# Patient Record
Sex: Female | Born: 1935 | Smoking: Current every day smoker
Health system: Southern US, Community
[De-identification: ages and names within clinical notes are randomized; demographics above are authoritative.]

## PROBLEM LIST (undated history)

## (undated) DIAGNOSIS — M1990 Unspecified osteoarthritis, unspecified site: Secondary | ICD-10-CM

## (undated) HISTORY — PX: ABDOMINAL SURGERY: SHX537

## (undated) HISTORY — PX: ABDOMINAL HYSTERECTOMY: SHX81

## (undated) HISTORY — DX: Unspecified osteoarthritis, unspecified site: M19.90

## (undated) HISTORY — PX: FOOT SURGERY: SHX648

---

## 2014-09-25 ENCOUNTER — Ambulatory Visit: Payer: Self-pay | Admitting: Podiatry

## 2014-10-09 ENCOUNTER — Ambulatory Visit: Payer: Self-pay | Admitting: Podiatry

## 2014-10-23 ENCOUNTER — Ambulatory Visit: Payer: Self-pay | Admitting: Podiatry

## 2014-11-08 ENCOUNTER — Ambulatory Visit (INDEPENDENT_AMBULATORY_CARE_PROVIDER_SITE_OTHER): Payer: Medicare Other | Admitting: Podiatry

## 2014-11-08 ENCOUNTER — Encounter: Payer: Self-pay | Admitting: Podiatry

## 2014-11-08 VITALS — BP 131/64 | HR 67 | Resp 12

## 2014-11-08 DIAGNOSIS — B351 Tinea unguium: Secondary | ICD-10-CM

## 2014-11-08 DIAGNOSIS — Q828 Other specified congenital malformations of skin: Secondary | ICD-10-CM

## 2014-11-08 DIAGNOSIS — M79676 Pain in unspecified toe(s): Secondary | ICD-10-CM

## 2014-11-08 NOTE — Patient Instructions (Signed)

## 2014-11-08 NOTE — Progress Notes (Signed)
   Subjective:    Patient ID: Jackie Chandler, female    DOB: 02-Jan-1925, 80 y.o.   MRN: [id number]  HPI  N- THICK, LONG L-B/L  TOENAILS D-LONG TERM O-SLOWLY C-SAME A-NONE T-TRIM  Patient denies previous podiatric care  Review of Systems  Musculoskeletal: Positive for gait problem.  All other systems reviewed and are negative.      Objective:   Physical Exam  Orientated 3 patient presents with daughter  Vascular: DP pulses 2/4 bilaterally PT pulses 2/4 bilaterally Capillary fill is immediate bilaterally Mild pitting edema dorsal ankles bilaterally  Neurological: Ankle reflex equal and reactive bilaterally Vibratory sensation nonreactive bilaterally Sensation to 10 g monofilament wire intact 1/5 right and 0/5 left  Dermatological: The toenails are elongated, hypertrophic, discolored 6-10 Nucleated plantar keratoses fifth MPJ bilaterally Surgical scar dorsal second MPJ  Musculoskeletal Hammertoe deformity second right No restriction ankle, subtalar, midtarsal joints bilaterally      Assessment & Plan:   Assessment: Peripheral neuropathy bilaterally Symptomatic onychomycoses 6-10 Porokeratosis 2  Plan: Debride nails 10 and porokeratosis 2 without a bleeding  Reappoint 3 months

## 2014-11-09 ENCOUNTER — Encounter: Payer: Self-pay | Admitting: Podiatry

## 2015-02-07 ENCOUNTER — Ambulatory Visit: Payer: Medicare Other | Admitting: Podiatry

## 2015-03-21 ENCOUNTER — Encounter: Payer: Self-pay | Admitting: Podiatry

## 2015-03-21 ENCOUNTER — Ambulatory Visit (INDEPENDENT_AMBULATORY_CARE_PROVIDER_SITE_OTHER): Payer: Medicare Other | Admitting: Podiatry

## 2015-03-21 DIAGNOSIS — Q828 Other specified congenital malformations of skin: Secondary | ICD-10-CM | POA: Diagnosis not present

## 2015-03-21 DIAGNOSIS — M79676 Pain in unspecified toe(s): Secondary | ICD-10-CM

## 2015-03-21 DIAGNOSIS — B351 Tinea unguium: Secondary | ICD-10-CM | POA: Diagnosis not present

## 2015-03-21 NOTE — Progress Notes (Signed)
Patient ID: Jackie Chandler, female   DOB: 04-05-1925, 81 y.o.   MRN: [id number]   Subjective This patient presents complaining of painful toenails and a painful plantar callus on the right foot  Objective: Orientated 3 patient presents with her daughter and treatment room  The toenails are elongated, hypertrophic, incurvated, discolored and tender to direct palpation 6-10 Large nucleated plantar keratoses sub-fifth MPJ right  Assessment: Symptomatic onychomycoses 6-10 Porokeratosis 1  Plan: Debride nails 10 and keratoses 1 without any bleeding  Reappoint 3 months

## 2015-06-27 ENCOUNTER — Encounter: Payer: Self-pay | Admitting: Podiatry

## 2015-06-27 ENCOUNTER — Ambulatory Visit (INDEPENDENT_AMBULATORY_CARE_PROVIDER_SITE_OTHER): Payer: Medicare Other | Admitting: Podiatry

## 2015-06-27 DIAGNOSIS — M79676 Pain in unspecified toe(s): Secondary | ICD-10-CM

## 2015-06-27 DIAGNOSIS — Q828 Other specified congenital malformations of skin: Secondary | ICD-10-CM | POA: Diagnosis not present

## 2015-06-27 DIAGNOSIS — B351 Tinea unguium: Secondary | ICD-10-CM

## 2015-06-28 NOTE — Progress Notes (Signed)
Patient ID: Jackie Chandler, female   DOB: 09/05/1925, 81 y.o.   MRN: [id number]  Subjective: This patient presents today complaining of painful toenails and a painful plantar callus on the right foot  Objective: Nucleated keratoses sub-fifth MPJ right The toenails are hypertrophic, elongated, incurvated, discolored and tender to direct palpation 6-10  Assessment: Symptomatic onychomycoses 6-10 Porokeratosis 1  Plan: Debrided toenails 10 and keratoses 1 without any bleeding  Reappoint 3

## 2015-09-10 ENCOUNTER — Ambulatory Visit: Payer: Self-pay | Admitting: Internal Medicine

## 2015-09-28 ENCOUNTER — Ambulatory Visit: Payer: Self-pay | Admitting: Internal Medicine

## 2015-10-02 ENCOUNTER — Ambulatory Visit (INDEPENDENT_AMBULATORY_CARE_PROVIDER_SITE_OTHER): Payer: Medicare Other | Admitting: Podiatry

## 2015-10-02 ENCOUNTER — Encounter: Payer: Self-pay | Admitting: Podiatry

## 2015-10-02 DIAGNOSIS — M79676 Pain in unspecified toe(s): Secondary | ICD-10-CM

## 2015-10-02 DIAGNOSIS — Q828 Other specified congenital malformations of skin: Secondary | ICD-10-CM

## 2015-10-02 DIAGNOSIS — B351 Tinea unguium: Secondary | ICD-10-CM

## 2015-10-02 NOTE — Progress Notes (Signed)
Patient ID: Graciella BeltonBernice I Steele, female   DOB: 05/04/1925, 81 y.o.   MRN: [id number]  Subjective: This patient presents today complaining of painful toenails and walking wearing shoes and a painful plantar callus on the right foot. Patient is requesting debridement of nails and the painful callus  Objective: Pleasant orientated 3 No open skin lesions bilaterally The toenails are hypertrophic, discolored, incurvated, and tender direct palpation 6-10 Large nucleated plantar keratoses sub-fifth MPJ right  Assessment: Symptomatic onychomycoses 6-10 Porokeratosis 1  Plan: Debrided toenails 10 mechanically and electronically without any bleeding Debride were keratoses 1 without any bleeding  Reappoint 3 months

## 2015-11-02 ENCOUNTER — Other Ambulatory Visit (INDEPENDENT_AMBULATORY_CARE_PROVIDER_SITE_OTHER): Payer: Medicare Other

## 2015-11-02 ENCOUNTER — Ambulatory Visit (INDEPENDENT_AMBULATORY_CARE_PROVIDER_SITE_OTHER): Payer: Medicare Other | Admitting: Internal Medicine

## 2015-11-02 ENCOUNTER — Encounter: Payer: Self-pay | Admitting: Internal Medicine

## 2015-11-02 VITALS — BP 152/68 | HR 74 | Temp 97.9°F | Resp 18 | Ht 64.0 in | Wt 171.0 lb

## 2015-11-02 DIAGNOSIS — R600 Localized edema: Secondary | ICD-10-CM

## 2015-11-02 LAB — COMPREHENSIVE METABOLIC PANEL
ALT: 18 U/L (ref 0–35)
AST: 23 U/L (ref 0–37)
Albumin: 4 g/dL (ref 3.5–5.2)
Alkaline Phosphatase: 63 U/L (ref 39–117)
BILIRUBIN TOTAL: 0.4 mg/dL (ref 0.2–1.2)
BUN: 18 mg/dL (ref 6–23)
CHLORIDE: 104 meq/L (ref 96–112)
CO2: 25 meq/L (ref 19–32)
Calcium: 9.6 mg/dL (ref 8.4–10.5)
Creatinine, Ser: 0.85 mg/dL (ref 0.40–1.20)
GFR: 66.98 mL/min (ref >60.00)
GLUCOSE: 96 mg/dL (ref 70–99)
Potassium: 4.1 mEq/L (ref 3.5–5.1)
SODIUM: 138 meq/L (ref 135–145)
Total Protein: 7.4 g/dL (ref 6.0–8.3)

## 2015-11-02 LAB — LIPID PANEL
CHOL/HDL RATIO: 3
Cholesterol: 174 mg/dL (ref 0–200)
HDL: 58.5 mg/dL (ref >39.00)
LDL CALC: 101 mg/dL — AB (ref 0–99)
NonHDL: 115.35
Triglycerides: 71 mg/dL (ref 0.0–149.0)
VLDL: 14.2 mg/dL (ref 0.0–40.0)

## 2015-11-02 LAB — TSH: TSH: 0.97 u[IU]/mL (ref 0.35–4.50)

## 2015-11-02 LAB — CBC
HCT: 38.8 % (ref 36.0–46.0)
HEMOGLOBIN: 12.8 g/dL (ref 12.0–15.0)
MCHC: 32.9 g/dL (ref 30.0–36.0)
MCV: 87.8 fl (ref 78.0–100.0)
PLATELETS: 288 10*3/uL (ref 150.0–400.0)
RBC: 4.42 Mil/uL (ref 3.87–5.11)
RDW: 14.9 % (ref 11.5–15.5)
WBC: 5.2 10*3/uL (ref 4.0–10.5)

## 2015-11-02 LAB — BRAIN NATRIURETIC PEPTIDE: Pro B Natriuretic peptide (BNP): 31 pg/mL (ref 0.0–100.0)

## 2015-11-02 MED ORDER — FUROSEMIDE 20 MG PO TABS: 20.0000 mg | ORAL_TABLET | Freq: Every day | ORAL | Status: DC | PRN

## 2015-11-02 NOTE — Assessment & Plan Note (Signed)
Since no health care in some time will check CMP, CBC, TSH, BNP for etiology. Talked to them about venous insufficiency as the most common cause. Talked to them about leg elevation. They declined compression stockings. Rx for lasix 20 mg prn for bad swelling although she is not sure that she wants to use that.

## 2015-11-02 NOTE — Patient Instructions (Signed)
We will send in the fluid medicine that you can use only when the swelling is bad. Called lasix (furosemide) and do not take more than 1 per day. It will cause you to need to use the restroom more often.   We are checking the labs today and will call you with the results.   Make sure to work on keeping your legs up to help with the swelling as well.

## 2015-11-02 NOTE — Progress Notes (Signed)
Pre visit review using our clinic review tool, if applicable. No additional management support is needed unless otherwise documented below in the visit note. 

## 2015-11-02 NOTE — Progress Notes (Signed)
   Subjective:    Patient ID: Jackie Chandler, female    DOB: 1925/07/06, 81 y.o.   MRN: [id number]  HPI The patient is an 81 YO new female coming in for edema in her legs. She has noticed it for some time but worse recently. She has also been dangling her legs more recently as she is not walking as much. She does well overall but struggles with arthritis. Takes naproxen for her arthritis with good success. Not much health care over her lifetime and feels she has done well. No reported heart attack, stroke in the past. No SOB with exertion but limited by arthritis in her knees.   [facility], social history reviewed and updated.   Review of Systems  Constitutional: Positive for activity change. Negative for fever, chills, appetite change, fatigue and unexpected weight change.  HENT: Negative.   Eyes: Negative.   Respiratory: Negative for cough, chest tightness, shortness of breath and wheezing.   Cardiovascular: Positive for leg swelling. Negative for chest pain and palpitations.  Gastrointestinal: Negative for nausea, abdominal pain, diarrhea, constipation, blood in stool and abdominal distention.  Musculoskeletal: Positive for back pain and arthralgias. Negative for myalgias and gait problem.  Skin: Negative.   Neurological: Negative.   Psychiatric/Behavioral: Negative.       Objective:   Physical Exam  Constitutional: She appears well-developed and well-nourished.  Overweight  HENT:  Head: Normocephalic and atraumatic.  Right Ear: External ear normal.  Left Ear: External ear normal.  Eyes: EOM are normal.  Neck: Normal range of motion.  Cardiovascular: Normal rate and regular rhythm.   Pulmonary/Chest: Effort normal and breath sounds normal. No respiratory distress. She has no wheezes. She has no rales.  Abdominal: Soft. Bowel sounds are normal. She exhibits no distension. There is no tenderness. There is no rebound.  Musculoskeletal: She exhibits edema.  1-2+ edema to mid calf, no  tenderness  Skin: Skin is warm and dry.   Filed Vitals:   11/02/15 1127  BP: 184/60  Pulse: 74  Temp: 97.9 F (36.6 C)  TempSrc: Oral  Resp: 18  Height: 5\' 4"  (1.626 m)  Weight: 171 lb (77.565 kg)  SpO2: 98%      Assessment & Plan:

## 2015-11-08 DIAGNOSIS — Z7689 Persons encountering health services in other specified circumstances: Secondary | ICD-10-CM

## 2015-11-20 ENCOUNTER — Ambulatory Visit: Payer: Medicare Other | Admitting: Internal Medicine

## 2016-01-16 ENCOUNTER — Ambulatory Visit: Payer: Medicare Other | Admitting: Podiatry

## 2016-01-25 ENCOUNTER — Encounter: Payer: Self-pay | Admitting: Internal Medicine

## 2016-01-25 ENCOUNTER — Ambulatory Visit (INDEPENDENT_AMBULATORY_CARE_PROVIDER_SITE_OTHER): Payer: Medicare Other | Admitting: Internal Medicine

## 2016-01-25 VITALS — BP 158/62 | HR 79 | Temp 98.4°F | Resp 18 | Ht 64.0 in | Wt 169.0 lb

## 2016-01-25 DIAGNOSIS — R2681 Unsteadiness on feet: Secondary | ICD-10-CM

## 2016-01-25 DIAGNOSIS — R296 Repeated falls: Secondary | ICD-10-CM | POA: Diagnosis not present

## 2016-01-25 NOTE — Progress Notes (Signed)
Pre visit review using our clinic review tool, if applicable. No additional management support is needed unless otherwise documented below in the visit note. 

## 2016-01-25 NOTE — Patient Instructions (Signed)
We have given you the prescription for the rolling walker with the seat that should be much safer and easier to get around.   We will have physical therapy to come out to the house and help with your strength and balance.

## 2016-01-25 NOTE — Assessment & Plan Note (Signed)
Talked to them about the needs for ambulation and strength to help overall. She will try rolling walker for safety and ambulation. Order for home health PT/OT to facilitate safety. No serious injury with her falls.

## 2016-01-25 NOTE — Assessment & Plan Note (Signed)
Rx for rolling walker with seat and hand breaks. Also rx for home health PT/OT to facilitate safety. Talked to them about making the home as safe as possible by removing trip hazards.

## 2016-01-25 NOTE — Progress Notes (Signed)
   Subjective:    Patient ID: Jackie Chandler, female    DOB: July 13, 1925, 82 y.o.   MRN: [id number]  HPI The patient is an 82 YO female coming in for evaluate for her balance. She was thinking that she may need a wheelchair. Currently using a 4 pronged cane and losing her balance. Many close falls and caught herself on furniture. She has not sustained serious injury. This keeps her from leaving the house and from doing much. Her strength is poor. She has had this problem for many months and it is worsening in the last 1-2 months.   Review of Systems  Constitutional: Positive for activity change. Negative for fever, chills, appetite change, fatigue and unexpected weight change.  HENT: Negative.   Eyes: Negative.   Respiratory: Negative for cough, chest tightness, shortness of breath and wheezing.   Cardiovascular: Positive for leg swelling. Negative for chest pain and palpitations.  Gastrointestinal: Negative for nausea, abdominal pain, diarrhea, constipation, blood in stool and abdominal distention.  Musculoskeletal: Positive for back pain, arthralgias and gait problem. Negative for myalgias.  Skin: Negative.   Neurological: Positive for weakness. Negative for seizures, syncope, facial asymmetry and numbness.  Psychiatric/Behavioral: Negative.       Objective:   Physical Exam  Constitutional: She is oriented to person, place, and time. She appears well-developed and well-nourished.  Overweight  HENT:  Head: Normocephalic and atraumatic.  Right Ear: External ear normal.  Left Ear: External ear normal.  Eyes: EOM are normal.  Neck: Normal range of motion.  Cardiovascular: Normal rate and regular rhythm.   Pulmonary/Chest: Effort normal and breath sounds normal. No respiratory distress. She has no wheezes. She has no rales.  Abdominal: Soft. Bowel sounds are normal. She exhibits no distension. There is no tenderness. There is no rebound.  Musculoskeletal: She exhibits edema.  1-2+ edema  to mid calf, no tenderness  Neurological: She is alert and oriented to person, place, and time.  Gait unstable with cane, needs to lean on her daughter to walk safely, slow gait.   Skin: Skin is warm and dry.   Filed Vitals:   01/25/16 1431 01/25/16 1502  BP: 180/80 158/62  Pulse: 79   Temp: 98.4 F (36.9 C)   TempSrc: Oral   Resp: 18   Height:  (1.626 m)   Weight: 169 lb (76.658 kg)   SpO2: 98%       Assessment & Plan:

## 2016-02-05 ENCOUNTER — Ambulatory Visit (INDEPENDENT_AMBULATORY_CARE_PROVIDER_SITE_OTHER): Payer: Medicare Other | Admitting: Podiatry

## 2016-02-05 ENCOUNTER — Encounter: Payer: Self-pay | Admitting: Podiatry

## 2016-02-05 DIAGNOSIS — M79676 Pain in unspecified toe(s): Secondary | ICD-10-CM

## 2016-02-05 DIAGNOSIS — B351 Tinea unguium: Secondary | ICD-10-CM

## 2016-02-05 DIAGNOSIS — Q828 Other specified congenital malformations of skin: Secondary | ICD-10-CM | POA: Diagnosis not present

## 2016-02-05 NOTE — Progress Notes (Signed)
Patient ID: Jackie Chandler, female   DOB: [birth year], 82 y.o.   MRN: [id number]  This patient presents today complaining of painful toenails and walking wearing shoes and a painful plantar callus on the right foot. Patient is requesting debridement of nails and the painful callus Her daughter is present in the treatment room today  Objective: Pleasant orientated 3 No open skin lesions bilaterally The toenails are hypertrophic, discolored, incurvated, and tender direct palpation 6-10 Large nucleated plantar keratoses sub-fifth MPJ right  Assessment: Symptomatic onychomycoses 6-10 Porokeratosis 1  Plan: Debrided toenails 10 mechanically and electronically without any bleeding Debride were keratoses 1 without any bleeding  Reappoint 3 months

## 2016-05-07 ENCOUNTER — Encounter (INDEPENDENT_AMBULATORY_CARE_PROVIDER_SITE_OTHER): Payer: Medicare Other | Admitting: Podiatry

## 2016-05-07 NOTE — Progress Notes (Signed)
This encounter was created in error - please disregard.

## 2016-05-21 ENCOUNTER — Observation Stay (HOSPITAL_COMMUNITY): Payer: Medicare Other

## 2016-05-21 ENCOUNTER — Observation Stay (HOSPITAL_COMMUNITY)
Admission: EM | Admit: 2016-05-21 | Discharge: 2016-05-22 | Disposition: A | Discharge status: Home Health | Payer: Medicare Other | Attending: Internal Medicine | Admitting: Internal Medicine

## 2016-05-21 ENCOUNTER — Observation Stay (HOSPITAL_BASED_OUTPATIENT_CLINIC_OR_DEPARTMENT_OTHER): Payer: Medicare Other

## 2016-05-21 ENCOUNTER — Other Ambulatory Visit: Payer: Self-pay

## 2016-05-21 ENCOUNTER — Encounter (HOSPITAL_COMMUNITY): Payer: Self-pay | Admitting: Emergency Medicine

## 2016-05-21 DIAGNOSIS — M7989 Other specified soft tissue disorders: Secondary | ICD-10-CM | POA: Diagnosis present

## 2016-05-21 DIAGNOSIS — I890 Lymphedema, not elsewhere classified: Secondary | ICD-10-CM | POA: Diagnosis not present

## 2016-05-21 DIAGNOSIS — I509 Heart failure, unspecified: Secondary | ICD-10-CM

## 2016-05-21 DIAGNOSIS — I5022 Chronic systolic (congestive) heart failure: Secondary | ICD-10-CM | POA: Diagnosis not present

## 2016-05-21 DIAGNOSIS — R269 Unspecified abnormalities of gait and mobility: Secondary | ICD-10-CM | POA: Diagnosis not present

## 2016-05-21 DIAGNOSIS — I872 Venous insufficiency (chronic) (peripheral): Principal | ICD-10-CM | POA: Insufficient documentation

## 2016-05-21 DIAGNOSIS — R5381 Other malaise: Secondary | ICD-10-CM | POA: Diagnosis not present

## 2016-05-21 DIAGNOSIS — F17200 Nicotine dependence, unspecified, uncomplicated: Secondary | ICD-10-CM | POA: Insufficient documentation

## 2016-05-21 DIAGNOSIS — Z23 Encounter for immunization: Secondary | ICD-10-CM | POA: Insufficient documentation

## 2016-05-21 DIAGNOSIS — W19XXXA Unspecified fall, initial encounter: Secondary | ICD-10-CM | POA: Diagnosis not present

## 2016-05-21 DIAGNOSIS — R918 Other nonspecific abnormal finding of lung field: Secondary | ICD-10-CM | POA: Diagnosis not present

## 2016-05-21 DIAGNOSIS — I5032 Chronic diastolic (congestive) heart failure: Secondary | ICD-10-CM | POA: Insufficient documentation

## 2016-05-21 DIAGNOSIS — Z880 Allergy status to penicillin: Secondary | ICD-10-CM | POA: Diagnosis not present

## 2016-05-21 DIAGNOSIS — R531 Weakness: Secondary | ICD-10-CM | POA: Diagnosis not present

## 2016-05-21 DIAGNOSIS — R0602 Shortness of breath: Secondary | ICD-10-CM | POA: Insufficient documentation

## 2016-05-21 DIAGNOSIS — R404 Transient alteration of awareness: Secondary | ICD-10-CM | POA: Diagnosis not present

## 2016-05-21 LAB — ECHOCARDIOGRAM COMPLETE
AO mean calculated velocity dopler: 168 cm/s
AOPV: 0.41 m/s
AOVTI: 49.9 cm
AV Area VTI index: 0.84 cm2/m2
AV Area VTI: 1.4 cm2
AV Peak grad: 24 mmHg
AV VEL mean LVOT/AV: 0.44
AV area mean vel ind: 0.79 cm2/m2
AV peak Index: 0.73
AVAREAMEANV: 1.52 cm2
AVG: 13 mmHg
AVPKVEL: 245 cm/s
CHL CUP AV VALUE AREA INDEX: 0.84
CHL CUP AV VEL: 1.62
CHL CUP TV REG PEAK VELOCITY: 278 cm/s
E decel time: 194 msec
E/e' ratio: 10
FS: 23 % — AB (ref 28–44)
HEIGHTINCHES: 65 in
IV/PV OW: 1.14
LA diam end sys: 37 mm
LADIAMINDEX: 1.93 cm/m2
LASIZE: 37 mm
LAVOL: 47.3 mL
LAVOLA4C: 46.9 mL
LAVOLIN: 24.6 mL/m2
LV E/e'average: 10
LV PW d: 10.8 mm — AB (ref 0.6–1.1)
LV TDI E'MEDIAL: 5.33
LVEEMED: 10
LVELAT: 7.18 cm/s
LVOT SV: 81 mL
LVOT VTI: 23.3 cm
LVOT area: 3.46 cm2
LVOT diameter: 21 mm
LVOT peak grad rest: 4 mmHg
LVOTPV: 99.4 cm/s
LVOTVTI: 0.47 cm
MV Dec: 194
MV Peak grad: 2 mmHg
MV pk E vel: 71.8 m/s
MVPKAVEL: 128 m/s
TDI e' lateral: 7.18
TRMAXVEL: 278 cm/s
Valve area: 1.62 cm2
WEIGHTICAEL: 2987.2 [oz_av]

## 2016-05-21 LAB — URINALYSIS, ROUTINE W REFLEX MICROSCOPIC
BILIRUBIN URINE: NEGATIVE
GLUCOSE, UA: NEGATIVE mg/dL
Hgb urine dipstick: NEGATIVE
Ketones, ur: NEGATIVE mg/dL
Leukocytes, UA: NEGATIVE
NITRITE: NEGATIVE
PH: 7.5 (ref 5.0–8.0)
Protein, ur: NEGATIVE mg/dL
SPECIFIC GRAVITY, URINE: 1.007 (ref 1.005–1.030)

## 2016-05-21 LAB — BASIC METABOLIC PANEL
Anion gap: 8 (ref 5–15)
BUN: 15 mg/dL (ref 6–20)
CALCIUM: 9.6 mg/dL (ref 8.9–10.3)
CO2: 26 mmol/L (ref 22–32)
CREATININE: 0.86 mg/dL (ref 0.44–1.00)
Chloride: 103 mmol/L (ref 101–111)
GFR, EST NON AFRICAN AMERICAN: 58 mL/min — AB (ref >60)
GLUCOSE: 114 mg/dL — AB (ref 65–99)
Potassium: 4.2 mmol/L (ref 3.5–5.1)
Sodium: 137 mmol/L (ref 135–145)

## 2016-05-21 LAB — CBC WITH DIFFERENTIAL/PLATELET
BASOS PCT: 0 %
Basophils Absolute: 0 10*3/uL (ref 0.0–0.1)
EOS PCT: 1 %
Eosinophils Absolute: 0 10*3/uL (ref 0.0–0.7)
HEMATOCRIT: 37.6 % (ref 36.0–46.0)
Hemoglobin: 12.3 g/dL (ref 12.0–15.0)
Lymphocytes Relative: 11 %
Lymphs Abs: 0.9 10*3/uL (ref 0.7–4.0)
MCH: 28.5 pg (ref 26.0–34.0)
MCHC: 32.7 g/dL (ref 30.0–36.0)
MCV: 87.2 fL (ref 78.0–100.0)
MONO ABS: 0.5 10*3/uL (ref 0.1–1.0)
MONOS PCT: 6 %
NEUTROS ABS: 6.7 10*3/uL (ref 1.7–7.7)
Neutrophils Relative %: 82 %
PLATELETS: 256 10*3/uL (ref 150–400)
RBC: 4.31 MIL/uL (ref 3.87–5.11)
RDW: 14.2 % (ref 11.5–15.5)
WBC: 8.2 10*3/uL (ref 4.0–10.5)

## 2016-05-21 LAB — TROPONIN I: TROPONIN I: 0.03 ng/mL (ref <0.031)

## 2016-05-21 LAB — BRAIN NATRIURETIC PEPTIDE: B Natriuretic Peptide: 30.2 pg/mL (ref 0.0–100.0)

## 2016-05-21 MED ORDER — ACETAMINOPHEN 325 MG PO TABS: 650.0000 mg | ORAL_TABLET | Freq: Four times a day (QID) | ORAL | Status: DC | PRN

## 2016-05-21 MED ORDER — ACETAMINOPHEN 650 MG RE SUPP
650.0000 mg | Freq: Four times a day (QID) | RECTAL | Status: DC | PRN
Start: 2016-05-21 — End: 2016-05-22

## 2016-05-21 MED ORDER — ONDANSETRON HCL 4 MG/2ML IJ SOLN: 4.0000 mg | Freq: Four times a day (QID) | INTRAMUSCULAR | Status: DC | PRN

## 2016-05-21 MED ORDER — ONDANSETRON HCL 4 MG PO TABS: 4.0000 mg | ORAL_TABLET | Freq: Four times a day (QID) | ORAL | Status: DC | PRN

## 2016-05-21 MED ORDER — PNEUMOCOCCAL VAC POLYVALENT 25 MCG/0.5ML IJ INJ
0.5000 mL | INJECTION | INTRAMUSCULAR | Status: AC
  Administered 2016-05-22: 0.5 mL via INTRAMUSCULAR
  Filled 2016-05-21: qty 0.5

## 2016-05-21 MED ORDER — FUROSEMIDE 10 MG/ML IJ SOLN
40.0000 mg | Freq: Once | INTRAMUSCULAR | Status: AC
  Administered 2016-05-21: 40 mg via INTRAVENOUS
  Filled 2016-05-21: qty 4

## 2016-05-21 MED ORDER — TRAZODONE HCL 50 MG PO TABS
25.0000 mg | ORAL_TABLET | Freq: Every evening | ORAL | Status: DC | PRN
Start: 2016-05-21 — End: 2016-05-22

## 2016-05-21 MED ORDER — ENOXAPARIN SODIUM 40 MG/0.4ML ~~LOC~~ SOLN
40.0000 mg | SUBCUTANEOUS | Status: DC
  Administered 2016-05-21: 40 mg via SUBCUTANEOUS
  Filled 2016-05-21 (×3): qty 0.4

## 2016-05-21 MED ORDER — POLYETHYLENE GLYCOL 3350 17 G PO PACK: 17.0000 g | PACK | Freq: Every day | ORAL | Status: DC | PRN

## 2016-05-21 MED ORDER — BISACODYL 5 MG PO TBEC: 5.0000 mg | DELAYED_RELEASE_TABLET | Freq: Every day | ORAL | Status: DC | PRN

## 2016-05-21 NOTE — Care Management Obs Status (Signed)
MEDICARE OBSERVATION STATUS NOTIFICATION   Patient Details  Name: Jackie Chandler MRN: [id number] Date of Birth: [birth year]   Medicare Observation Status Notification Given:  Yes    Lawerance Sabalebbie Edyth Glomb, RN 05/21/2016, 8:57 AM

## 2016-05-21 NOTE — Progress Notes (Signed)
Echocardiogram 2D Echocardiogram has been performed.  Nolon RodBrown, Tony 05/21/2016, 9:48 AM

## 2016-05-21 NOTE — ED Notes (Signed)
Pt ambulated in the hall w/ the assistance of a walker and two staff members.  Pt was very unsteady but stated that was how she "normally walked."

## 2016-05-21 NOTE — Care Management Note (Signed)
Case Management Note  Patient Details  Name: Dicie BeamBernice C Edelson MRN: [id number] Date of Birth: Apr 28, 1925  Subjective/Objective:                 Spoke with patient and daughter at the bedside. Patient lives at home alone, daughter is primary caregiver at patient's house everyday to check on her. Patient states that she needs rolator prior to DC. Order placed by CM, [facility] Patient denies needing assistance with medications/ transportation. Patient and daughter waiting for PT eval to determine plan for DC. Insurance is not a barrier for SNF if needed. In OBS for LE swelling.    Action/Plan:  Rolator ordered. CM to assist w/ DC planning as needed.  Expected Discharge Date:                  Expected Discharge Plan:   (HH vs. SNF)  In-House Referral:  Clinical Social Work  Discharge planning Services  CM Consult  Post Acute Care Choice:  Durable Medical Equipment Choice offered to:     DME Arranged:  Walker rolling with seat DME Agency:  [facility]  HH Arranged:    HH Agency:     Status of Service:  In process, will continue to follow  If discussed at Long Length of Stay Meetings, dates discussed:    Additional Comments:  Lawerance SabalDebbie Cyara Devoto, RN 05/21/2016, 9:22 AM

## 2016-05-21 NOTE — ED Provider Notes (Signed)
CSN: [id number]     Arrival date & time 05/21/16  0137 History  By signing my name below, I, Arianna Nassar, attest that this documentation has been prepared under the direction and in the presence of Shon Baton, MD.  Electronically Signed: Octavia Heir, ED Scribe. 05/21/2016. 2:22 AM.   Chief Complaint  Patient presents with  . Fall  . Leg Swelling     The history is provided by the patient and the EMS personnel. No language interpreter was used.   HPI Comments: Jackie Chandler is a 82 y.o. female brought in by ambulance, who presents to the Emergency Department complaining of sudden onset, gradual worsening, moderate, fall with associated bilateral leg swelling and cramping onset this evening. Pt states she was stood up to get out of her "tv chair" when she states she couldn't move her legs and states they gave out underneath her causing her to fall. Pt did not hit her head or lose consciousness.She slid to the floor.   She states she did not have any injury but her legs have gotten more swollen over the last several weeks making it more difficult for her to ambulate. Pt says that she is on fluid pills and was told by her doctor to take them as needed. Her last dose was on Monday. She currently ambulates with a cane. She denies chest pain, shortness of breath, and change in medication. No known history of heart failure.  Past Medical History  Diagnosis Date  . Arthritis    Past Surgical History  Procedure Laterality Date  . Abdominal surgery    . Abdominal hysterectomy    . Foot surgery Right    Family History  Problem Relation Age of Onset  . Heart disease Father   . Hyperlipidemia Father   . Diabetes Father   . Diabetes Paternal Grandmother   . Arthritis Paternal Grandfather    Social History  Substance Use Topics  . Smoking status: Current Every Day Smoker  . Smokeless tobacco: None  . Alcohol Use: No   OB History    No data available     Review of Systems   Respiratory: Negative for shortness of breath.   Cardiovascular: Positive for leg swelling. Negative for chest pain.  Musculoskeletal: Positive for myalgias.       Leg cramping  All other systems reviewed and are negative.     Allergies  Codeine and Penicillins  Home Medications   Prior to Admission medications   Medication Sig Start Date End Date Taking? Authorizing Provider  furosemide (LASIX) 20 MG tablet Take 1 tablet (20 mg total) by mouth daily as needed for fluid. Patient not taking: Reported on 01/25/2016 11/02/15   Myrlene Broker, MD   BP 145/77 mmHg  Pulse 81  Temp(Src) 98.5 F (36.9 C) (Oral)  Resp 18  Ht  (1.651 m)  Wt 165 lb (74.844 kg)  BMI 27.46 kg/m2  SpO2 97% Physical Exam  Constitutional: She is oriented to person, place, and time.  Chronically ill-appearing, no acute distress  HENT:  Head: Normocephalic and atraumatic.  Cardiovascular: Normal rate, regular rhythm and normal heart sounds.   No murmur heard. Pulmonary/Chest: Effort normal and breath sounds normal. No respiratory distress. She has no wheezes.  Abdominal: Soft. Bowel sounds are normal. There is no tenderness. There is no rebound.  Musculoskeletal: She exhibits edema.  3+ bilateral lower extremity edema to the knees  Neurological: She is alert and oriented to person, place,  and time.  5 out of 5 strength in all 4 extremities  Skin: Skin is warm and dry.  Psychiatric: She has a normal mood and affect.  Nursing note and vitals reviewed.   ED Course  Procedures  DIAGNOSTIC STUDIES: Oxygen Saturation is 97% on RA, normal by my interpretation.  COORDINATION OF CARE:    Labs Review Labs Reviewed  BASIC METABOLIC PANEL - Abnormal; Notable for the following:    Glucose, Bld 114 (*)    GFR calc non Af Amer 58 (*)    All other components within normal limits  CBC WITH DIFFERENTIAL/PLATELET  URINALYSIS, ROUTINE W REFLEX MICROSCOPIC (NOT AT [facility])    Imaging Review No  results found. I have personally reviewed and evaluated these images and lab results as part of my medical decision-making.   EKG Interpretation   Date/Time:  Wednesday May 21 2016 02:28:03 EDT Ventricular Rate:  81 PR Interval:    QRS Duration: 89 QT Interval:  397 QTC Calculation: 461 R Axis:   10 Text Interpretation:  Sinus rhythm Prolonged PR interval Confirmed by  HORTON  MD, COURTNEY ([id number]) on 05/21/2016 6:06:20 AM      MDM   Final diagnoses:  Swelling of lower extremity  Fall, initial encounter   Patient presents following a fall. Fall with relatively atraumatic. She reports that "her legs gave out." She reports increasing lower extremity edema last several weeks. Denies chest pain or shortness of breath. She has 3+ edema on exam. Strength appears preserved.  Basic lab work initiated. Potassium normal. Patient was given 40 mg of IV Lasix. EKG is nonischemic and reassuring. Urinalysis without evidence of urinary tract infection. No other evidence of traumatic injury. Attempted to ambulate the patient. She ambulated in the hall with a walker and two person assist. She is very unsteady. I'm concerned about her ability to ambulate independently safely Which is likely 100 from her lower extremity edema. No echo in our system.  Discussed with Dr. Montez Moritaarter. Will admit patient to observation for diuresis and physical therapy.  I personally performed the services described in this documentation, which was scribed in my presence. The recorded information has been reviewed and is accurate.   Shon Batonourtney F Horton, MD 05/21/16 [phone]

## 2016-05-21 NOTE — Progress Notes (Signed)
[name] [id number] Admission Data: 05/21/2016 8:32 AM Attending Provider: Michael LitterNikki Carter, MD  WUJ:[id number]:Elizabeth Alison MurrayA Crawford, MD Consults/ Treatment Team:    [name] is a 82 y.o. female patient admitted from ED awake, alert  & orientated  X 3,  Full Code, VSS - Blood pressure 169/67, pulse 75, temperature 98.5 F (36.9 C), temperature source Oral, resp. rate 16, height 5\' 5"  (1.651 m), weight 84.687 kg (186 lb 11.2 oz), SpO2 100 %., O2      no c/o shortness of breath, no c/o chest pain, no distress noted. Tele # 12 placed and pt is currently running:normal sinus rhythm.   IV site WDL:  antecubital right, condition patent and no redness with a transparent dsg that's clean dry and intact.  Allergies:   Allergies  Allergen Reactions  . Codeine   . Penicillins      Past Medical History  Diagnosis Date  . Arthritis     History:  obtained from the patient. Tobacco/alcohol: Smoked 1 packs per day for 50 years none  Pt orientation to unit, room and routine. Information packet given to patient/family and safety video watched.  Admission INP armband ID verified with patient/family, and in place. SR up x 2, fall risk assessment complete with Patient and family verbalizing understanding of risks associated with falls. Pt verbalizes an understanding of how to use the call bell and to call for help before getting out of bed.  Skin, clean-dry- intact without evidence of bruising, or skin tears.   No evidence of skin break down noted on exam. no rashes, no ecchymoses, Skin tag on L hip    Will cont to monitor and assist as needed.  Mazella Deen Consuella Loselaine, RN 05/21/2016 8:32 AM

## 2016-05-21 NOTE — Plan of Care (Signed)
Problem: Food- and Nutrition-Related Knowledge Deficit (NB-1.1) Goal: Nutrition education Formal process to instruct or train a patient/client in a skill or to impart knowledge to help patients/clients voluntarily manage or modify food choices and eating behavior to maintain or improve health. Nutrition Education Note  RD consulted for nutrition education regarding low sodium diet.  Pt is a very independent 82 year old woman who lives alone, but receives assistance from multiple family members. Her daughter assists with grocery shopping and meal preparation. Pt reports she consumes 2 meals daily and one snack (Breakfast: cereal OR grits, eggs, and bacon or sausage, Snack: fruit, Dinner: meat, starch, and vegetables). Pt reports she rarely seasons her food, but will occasionally use garlic salt or celery salt. Pt also admits to occasionally eating baked beans and spam.   RD provided "Low Sodium Nutrition Therapy" and "Sodium Free Flavoring Tips" handouts from the Academy of [employer] and Dietetics. Reviewed patient's dietary recall. Provided examples on ways to decrease sodium intake in diet. Discouraged intake of processed foods and use of salt shaker. Encouraged fresh fruits and vegetables as well as whole grain sources of carbohydrates to maximize fiber intake.   RD discussed why it is important for patient to adhere to diet recommendations, and emphasized the role of fluids, foods to avoid, and importance of weighing self daily. Teach back method used.  Expect fair compliance.  Body mass index is 31.07 kg/(m^2). Pt meets criteria for obesity, class I based on current BMI.  Current diet order is Heart Healthy, patient is consuming approximately n/a% of meals at this time. Labs and medications reviewed. No further nutrition interventions warranted at this time. RD contact information provided. If additional nutrition issues arise, please re-consult RD.   Jackie Chandler, RD, LDN, CDE Pager:  [phone] After hours Pager: [phone]

## 2016-05-21 NOTE — ED Notes (Signed)
MD at bedside. 

## 2016-05-21 NOTE — Evaluation (Signed)
Physical Therapy Evaluation Patient Details Name: Jackie Chandler Hachey MRN: [id number] DOB: February 08, 1925 Today's Date: 05/21/2016   History of Present Illness  Jackie Chandler Grafton is a 82 y.o. female with a Past Medical History of arthritis who presents with fall, physical deconditioning, and LE edema.  Clinical Impression  Patient presents with decreased independence with mobility due to deficits listed in PT problem list.  She will benefit from skilled PT in the acute setting to allow d/Chandler home with family support initially and follow up HHPT.  She is fearful of falling and was counseled on fall prevention, possible benefit of life alert system and importance of mobilizing despite fear.  Noted rollator already obtained for pt which she is fearful to use due to rolling too much.  Used regular rolling walker this session with encouragement, and pt feels able to borrow from family.  No further DME needs at this time.     Follow Up Recommendations Home health PT;Supervision/Assistance - 24 hour    Equipment Recommendations  None recommended by PT    Recommendations for Other Services       Precautions / Restrictions Precautions Precautions: Fall Precaution Comments: extremely fearful of falling      Mobility  Bed Mobility Overal bed mobility: Needs Assistance Bed Mobility: Supine to Sit     Supine to sit: Min assist;HOB elevated     General bed mobility comments: assist for legs off EOB and for bringing hips closer to EOB  Transfers Overall transfer level: Needs assistance Equipment used: Rolling walker (2 wheeled) Transfers: Sit to/from [employer];Stand Pivot Transfers Sit to Stand: Min assist Stand pivot transfers: Min assist       General transfer comment: asisist to rise from bed, then [facility] cues for hand placement, also encouragement due to fear of falling and PT bracing walker; pivot with [facility]W to [facility] with assist for walker management  Ambulation/Gait Ambulation/Gait  assistance: Min assist Ambulation Distance (Feet): 30 Feet Assistive device: Rolling walker (2 wheeled) Gait Pattern/deviations: Step-through pattern;Shuffle;Decreased stride length;Trunk flexed;Wide base of support     General Gait Details: fearful of falling, still with LE numbness and edema, but needs cues tor proximity to walker, and assist to steady walker (used regular RW even though rollator in the room due to pt fear of walker rolling away from her.)  Stairs            Wheelchair Mobility    Modified Rankin (Stroke Patients Only)       Balance Overall balance assessment: Needs assistance   Sitting balance-Leahy Scale: Good     Standing balance support: Bilateral upper extremity supported Standing balance-Leahy Scale: Poor Standing balance comment: severely fearful of falling, requires UE support for balance and assist for safety                             Pertinent Vitals/Pain Pain Assessment: No/denies pain    Home Living Family/patient expects to be discharged to:: Private residence Living Arrangements: Alone Available Help at Discharge: Family;Available PRN/intermittently Type of Home: House Home Access: Level entry     Home Layout: One level Home Equipment: Grab bars - tub/shower;Shower seat;Walker - 4 wheels;Cane - single point      Prior Function Level of Independence: Independent with assistive device(s);Needs assistance   Gait / Transfers Assistance Needed: was using cane, but since fall and leg swelling just got new rollator  ADL's / Homemaking Assistance Needed: daughter assists  with cooking, bathing  Comments: wears glasses     Hand Dominance        Extremity/Trunk Assessment   Upper Extremity Assessment: Generalized weakness           Lower Extremity Assessment: RLE deficits/detail;LLE deficits/detail RLE Deficits / Details: LE edema evident, but wearing TED's and reports is better, states numbness since having  swelling in legs; strength knee extension 4/5, ankle DF 3-/5 LLE Deficits / Details: LE edema evident, but wearing TED's and reports is better, states numbness since having swelling in legs; strength knee extension 4/5, ankle DF 3+/5  Cervical / Trunk Assessment: Kyphotic  Communication   Communication: No difficulties  Cognition Arousal/Alertness: Awake/alert Behavior During Therapy: WFL for tasks assessed/performed Overall Cognitive Status: Within Functional Limits for tasks assessed                      General Comments General comments (skin integrity, edema, etc.): demonstrated to pt how to use rollator to scoot around while sitting if too fearful to stand and walk on her own. Wrote out fall prevention that her daughter might be able to help her with if not already set up in the home.   Also educated need to try and arrange initial 24 hour assist for safety at d/Chandler and to try and borrow a regular RW from family member.     Exercises        Assessment/Plan    PT Assessment Patient needs continued PT services  PT Diagnosis Generalized weakness;Abnormality of gait   PT Problem List Decreased activity tolerance;Decreased balance;Decreased mobility;Decreased strength;Decreased safety awareness;Decreased knowledge of use of DME;Impaired sensation  PT Treatment Interventions DME instruction;Gait training;Balance training;Functional mobility training;Patient/family education;Therapeutic activities;Therapeutic exercise   PT Goals (Current goals can be found in the Care Plan section) Acute Rehab PT Goals Patient Stated Goal: To go home PT Goal Formulation: With patient Time For Goal Achievement: 05/28/16 Potential to Achieve Goals: Good    Frequency Min 3X/week   Barriers to discharge        Co-evaluation               End of Session Equipment Utilized During Treatment: Gait belt Activity Tolerance: Other (comment) (limited by fear) Patient left: in chair;with call  bell/phone within reach;with chair alarm set Nurse Communication: Mobility status    Functional Assessment Tool Used: Clinical Judgement Functional Limitation: Mobility: Walking and moving around Mobility: Walking and Moving Around Current Status [phone]): At least 20 percent but less than 40 percent impaired, limited or restricted Mobility: Walking and Moving Around Goal Status [phone]): At least 1 percent but less than 20 percent impaired, limited or restricted    Time: 1429-1521 PT Time Calculation (min) (ACUTE ONLY): 52 min   Charges:   PT Evaluation $PT Eval Moderate Complexity: 1 Procedure PT Treatments $Gait Training: 8-22 mins $Therapeutic Activity: 8-22 mins   PT G Codes:   PT G-Codes **NOT FOR INPATIENT CLASS** Functional Assessment Tool Used: Clinical Judgement Functional Limitation: Mobility: Walking and moving around Mobility: Walking and Moving Around Current Status (W2956): At least 20 percent but less than 40 percent impaired, limited or restricted Mobility: Walking and Moving Around Goal Status [phone]): At least 1 percent but less than 20 percent impaired, limited or restricted    Elray Mcgregor 05/21/2016, 3:51 PM  Sheran Lawless, PT [phone] 05/21/2016

## 2016-05-21 NOTE — ED Notes (Signed)
Per EMS, pt from home, had falled b/t the bed and a table and could not get up.  She reported the fall was caused by her legs being "swollen and tight...could not move them."  Hx of CHF.

## 2016-05-21 NOTE — ED Notes (Signed)
attempted to call report

## 2016-05-21 NOTE — H&P (Signed)
History and Physical    Jackie Chandler ZOX:[id number] DOB: Jul 24, 1925 DOA: 05/21/2016  PCP: Myrlene Broker, MD  Patient coming from:   home    Chief Complaint: fall at home  HPI: Jackie Chandler is a 82 y.o. female with medical history significant for arthritis. She doesn't see PCP on a regular basis, takes only prn lasix and OTC medications. Patient presented to ED during the night after a fall at home. Her legs just "gave out" when getting out of bed. She didn't hurt herself duing the fall nor did she hit her head. No preceding dizziness. No dyspnea. Patient has chronic bilateral lower ext edema and takes lasix every 3-4 days as needed. She eats normal diet without any sodium restriction. Saw PCP in December for edema. Labs including bnp TSH, CBC, chem profile were unremarkable.   Patient describes occasional cramping in both leg from knees upward into groins. Cramps aggravated or alleviated by anything.   ED Course:  Afebrile, VSS  IV lasix   EKG Interpretation  Date/Time:  Wednesday May 21 2016 02:28:03 EDT Ventricular Rate:  81 PR Interval:    QRS Duration: 89 QT Interval:  397 QTC Calculation: 461 R Axis:   10 Text Interpretation:  Sinus rhythm Prolonged PR interval Confirmed by HORTON  MD, COURTNEY ([id number]) on 05/21/2016 6:06:20 AM       Review of Systems: As per HPI, otherwise 10 point review of systems negative.   Past Medical History  Diagnosis Date  . Arthritis     Past Surgical History  Procedure Laterality Date  . Abdominal surgery    . Abdominal hysterectomy    . Foot surgery Right     Social History   Social History  . Marital Status: Unknown    Spouse Name: N/A  . Number of Children: N/A  . Years of Education: N/A   Occupational History  . Not on file.   Social History Main Topics  . Smoking status: Current Every Day Smoker  . Smokeless tobacco: Not on file  . Alcohol Use: No  . Drug Use: No  . Sexual Activity: Not on file    Other Topics Concern  . Not on file   Social History Narrative  Lives at home at home. Uses a cane for ambulation.   Allergies  Allergen Reactions  . Codeine   . Penicillins     Family History  Problem Relation Age of Onset  . Heart disease Father   . Hyperlipidemia Father   . Diabetes Father   . Diabetes Paternal Grandmother   . Arthritis Paternal Grandfather     Prior to Admission medications   Medication Sig Start Date End Date Taking? Authorizing Provider  furosemide (LASIX) 20 MG tablet Take 1 tablet (20 mg total) by mouth daily as needed for fluid. Patient not taking: Reported on 01/25/2016 11/02/15   Myrlene Broker, MD    Physical Exam: Filed Vitals:   05/21/16 0400 05/21/16 0430 05/21/16 0500 05/21/16 0630  BP: 132/77 134/62 145/77 152/72  Pulse: 83 84 81 71  Temp:      TempSrc:      Resp: Height:      Weight:      SpO2: 97% 94% 97% 98%    Constitutional:  Pleasant well developed black female in NAD, calm, comfortable Filed Vitals:   05/21/16 0400 05/21/16 0430 05/21/16 0500 05/21/16 0630  BP: 132/77 134/62 145/77 152/72  Pulse: 83 84 81  71  Temp:      TempSrc:      Resp: 15 15 18    Height:      Weight:      SpO2: 97% 94% 97% 98%   Eyes: PER, lids and conjunctivae normal ENMT: Mucous membranes are moist. Posterior pharynx clear of any exudate or lesions.Normal dentition.  Neck: normal, supple, no masses Respiratory: Bilaterally there are inspiratory crackles. No wheezing.  Normal respiratory effort. No accessory muscle use.  Cardiovascular: Regular rate and rhythm + murmur, 3+ BLE, 1+ pedal pulses.  Abdomen: no tenderness, no masses palpated. No hepatomegaly. Bowel sounds positive.  Musculoskeletal: no clubbing / cyanosis. No joint deformity upper and lower extremities. Good ROM, no contractures. Normal muscle tone.  Skin: no rashes, lesions, ulcers. Dark discoloration of skin in both lower extremities distally Neurologic: CN 2-12  grossly intact. Sensation intact, Strength 5/5 in all 4.  Psychiatric: Normal judgment and insight. Alert and oriented x 3. Normal mood.    Labs on Admission: I have personally reviewed following labs and imaging studies   Urine analysis:    Component Value Date/Time   COLORURINE YELLOW 05/21/2016 0539   APPEARANCEUR CLEAR 05/21/2016 0539   LABSPEC 1.007 05/21/2016 0539   PHURINE 7.5 05/21/2016 0539   GLUCOSEU NEGATIVE 05/21/2016 0539   HGBUR NEGATIVE 05/21/2016 0539   BILIRUBINUR NEGATIVE 05/21/2016 0539   KETONESUR NEGATIVE 05/21/2016 0539   PROTEINUR NEGATIVE 05/21/2016 0539   NITRITE NEGATIVE 05/21/2016 0539   LEUKOCYTESUR NEGATIVE 05/21/2016 0539    Assessment/Plan   Active Problems:   Swelling of lower extremity      Chronic bilateral lower extremity edema. CHF vrs venous insufficiency. Minimal workup done thus far .  -admit to OBS           -BNP -CXR -echocardiogram -low sodium diet - Dietary consult -elevate legs -daily wts, I+0 -received 40mg  lasix in ED, repeat dose this evening. Hopefully home in am. Takes  20mg  of lasix at home every few days. Would change to daily with outpatient follow up with PCP.   Fall. Larey SeatFell trying to get OOB this am, legs "gave out" -PT eval   DVT prophylaxis:   Lovenox Code Status:   Full code Family Communication:    Daughter in room and understands and agrees with plan of care.   Disposition Plan:  Discharge home in 24-48 hours              Consults called:    None  Admission status:  Observation - Medical bed -  Willette ClusterPaula Raden Byington NP [facility] Pager [phone]  If 7PM-7AM, please contact night-coverage [email or web address] Password [email or web address]  05/21/2016, 7:13 AM

## 2016-05-22 ENCOUNTER — Telehealth: Payer: Self-pay | Admitting: *Deleted

## 2016-05-22 DIAGNOSIS — I872 Venous insufficiency (chronic) (peripheral): Secondary | ICD-10-CM | POA: Diagnosis not present

## 2016-05-22 DIAGNOSIS — I5032 Chronic diastolic (congestive) heart failure: Secondary | ICD-10-CM | POA: Insufficient documentation

## 2016-05-22 DIAGNOSIS — R0602 Shortness of breath: Secondary | ICD-10-CM

## 2016-05-22 DIAGNOSIS — W19XXXA Unspecified fall, initial encounter: Secondary | ICD-10-CM | POA: Insufficient documentation

## 2016-05-22 DIAGNOSIS — M7989 Other specified soft tissue disorders: Secondary | ICD-10-CM

## 2016-05-22 LAB — BASIC METABOLIC PANEL
ANION GAP: 7 (ref 5–15)
BUN: 12 mg/dL (ref 6–20)
CALCIUM: 9.7 mg/dL (ref 8.9–10.3)
CO2: 29 mmol/L (ref 22–32)
Chloride: 101 mmol/L (ref 101–111)
Creatinine, Ser: 0.89 mg/dL (ref 0.44–1.00)
GFR, EST NON AFRICAN AMERICAN: 56 mL/min — AB (ref >60)
GLUCOSE: 131 mg/dL — AB (ref 65–99)
Potassium: 4.2 mmol/L (ref 3.5–5.1)
SODIUM: 137 mmol/L (ref 135–145)

## 2016-05-22 LAB — CBC
HCT: 39.2 % (ref 36.0–46.0)
Hemoglobin: 12.6 g/dL (ref 12.0–15.0)
MCH: 28.8 pg (ref 26.0–34.0)
MCHC: 32.1 g/dL (ref 30.0–36.0)
MCV: 89.5 fL (ref 78.0–100.0)
PLATELETS: 287 10*3/uL (ref 150–400)
RBC: 4.38 MIL/uL (ref 3.87–5.11)
RDW: 14.3 % (ref 11.5–15.5)
WBC: 6.2 10*3/uL (ref 4.0–10.5)

## 2016-05-22 MED ORDER — FUROSEMIDE 20 MG PO TABS: 30.0000 mg | ORAL_TABLET | Freq: Every day | ORAL | Status: DC

## 2016-05-22 NOTE — Care Management Note (Signed)
Case Management Note  Patient Details  Name: Jackie Chandler MRN: [id number] Date of Birth: Sep 24, 1925  Subjective/Objective:                  Subjective/Objective: Spoke with patient and daughter at the bedside. Patient lives at home alone, daughter is primary caregiver at patient's house everyday to check on her. Patient states that she needs rolator prior to DC. Order placed by CM, and [facility] Patient denies needing assistance with medications/ transportation. Patient and daughter waiting for PT eval to determine plan for DC. Insurance is not a barrier for SNF if needed. In OBS for LE swelling.    Action/Plan:  [facility] Referral placed, Rolator in room. Patient and daughter prefer rolator at this time (through insurance) and will get RW from friend to use at home if needed.   Expected Discharge Date:                  Expected Discharge Plan:  Home w Home Health Services (HH vs. SNF)  In-House Referral:  Clinical Social Work  Discharge planning Services  CM Consult  Post Acute Care Choice:  Durable Medical Equipment Choice offered to:     DME Arranged:  Walker rolling with seat DME Agency:  [facility]  HH Arranged:  RN, PT, Nurse's Aide HH Agency:  [facility]  Status of Service:  Completed, signed off  If discussed at Long Length of Stay Meetings, dates discussed:    Additional Comments:  Lawerance SabalDebbie Pius Byrom, RN 05/22/2016, 11:56 AM

## 2016-05-22 NOTE — Discharge Summary (Signed)
Physician Discharge Summary  Jackie BeamBernice C Mcguinn ZOX:[id number] DOB: November 02, 1925 DOA: 05/21/2016  PCP: Myrlene BrokerElizabeth A Crawford, MD  Admit date: 05/21/2016 Discharge date: 05/22/2016  Time spent: 35 minutes  Recommendations for Outpatient Follow-up:  Repeat BMET to follow electrolytes and renal function Reassess BP  Discharge Diagnoses:  Swelling of lower extremity FAll Chronic diastolic HF Physical deconditioning    Discharge Condition: stable and improved. Discharge home with home health services, advise to follow up with PCP in 10 days.  Diet recommendation: heart healthy/low sodium diet  Filed Weights   05/21/16 0152 05/21/16 0759 05/22/16 0519  Weight: 74.844 kg (165 lb) 84.687 kg (186 lb 11.2 oz) 85 kg (187 lb 6.3 oz)    History of present illness:  As per H&P written by Dr. Konrad DoloresMerrell and NP Willette ClusterPaula Guenther  82 y.o. female with medical history significant for arthritis. She doesn't see PCP on a regular basis, takes only prn lasix and OTC medications. Patient presented to ED during the night after a fall at home. Her legs just "gave out" when getting out of bed. She didn't hurt herself duing the fall nor did she hit her head. No preceding dizziness. No dyspnea. Patient has chronic bilateral lower ext edema and takes lasix every 3-4 days as needed. She eats normal diet without any sodium restriction. Saw PCP in December for edema. Labs including bnp TSH, CBC, chem profile were unremarkable.   Hospital Course:  1-Fall -due to physical deconditioning and worsening LE edema, making her difficult to maneuver her legs -no pain and no fracture appreciated -patient assessed by PT and recommendations were [facility]  2-LE edema: multifactorial: due to venous insufficiency and mild lymphedema -patient with significant improvement in swelling after diuresis and TED hoses apply -will be discharge on daily lasix, instruction to follow low sodium diet and to wear TED hose/stocking  socks -also advise to keep legs up when sitting   3-chronic diastolic heart failure:  -compensated -preserved EF -will add lasix daily to assist with BP control and also to help with her LE edema as mentioned above  4-physical deconditioning -as mentioned in problem number 1 [facility]     Procedures:  See below for x-ray reports   2-D echo - Left ventricle: The cavity size was normal. There was mild  concentric hypertrophy. Systolic function was normal. The  estimated ejection fraction was in the range of 55% to 60%. Wall  motion was normal; there were no regional wall motion  abnormalities. Doppler parameters are consistent with abnormal  left ventricular relaxation (grade 1 diastolic dysfunction). - Aortic valve: There was mild stenosis. Valve area (VTI): 1.62  cm^2. Valve area (Vmax): 1.4 cm^2. Valve area (Vmean): 1.52 cm^2. - Pulmonary arteries: PA peak pressure: 39 mm Hg (S).   Consultations:  None   Discharge Exam: Filed Vitals:   05/22/16 0059 05/22/16 0519  BP: 159/59 153/76  Pulse:  72  Temp:  97.9 F (36.6 C)  Resp:  18    General: afebrile, feeling better and reporting less swelling in her legs. No CP, no SOB and no orthopnea  Cardiovascular: S1 and S2, no rubs, no gallops Respiratory: CTA bilaterally  Abd: soft, NT, ND, positive BS Extremities: TED hoses in place, 1+ edema bilaterally, no tenderness on palpation   Discharge Instructions   Discharge Instructions    Diet - low sodium heart healthy    Complete by:  As directed      Discharge instructions  Complete by:  As directed   Follow low sodium diet (less than 2.4 gram daily) Please wear Ted Hose/stocking socks as instructed (12 hours on and 12 hours off) Keep leg elevated as much as possible Arrange follow up with PCP in 10 days  Maintain adequate hydration and follow instructions[facility]          Current Discharge Medication List     CONTINUE these medications which have CHANGED   Details  furosemide (LASIX) 20 MG tablet Take 1.5 tablets (30 mg total) by mouth daily. Qty: 45 tablet, Refills: 1       Allergies  Allergen Reactions  . Codeine   . Penicillins    Follow-up Information    Follow up with [facility].   Why:  Rolator to be delivered to room prior to DC.   Contact information:   [address] [place] [address] [phone]       Follow up with Myrlene BrokerElizabeth A Crawford, MD. Schedule an appointment as soon as possible for a visit in 10 days.   Specialty:  Internal Medicine   Contact information:   [address] [place] [address] [phone]       The results of significant diagnostics from this hospitalization (including imaging, microbiology, ancillary and laboratory) are listed below for reference.    Significant Diagnostic Studies: Dg Chest Port 1 View  05/21/2016  CLINICAL DATA:  82 year old female with history of bilateral lower extremities soft tissue swelling and some lower extremity discomfort. History of trauma from a fall 1 week ago. EXAM: PORTABLE CHEST 1 VIEW COMPARISON:  No priors. FINDINGS: Low lung volumes. Bibasilar opacities favored to predominantly reflect areas of subsegmental atelectasis, although developing areas of airspace consolidation are not excluded. No definite pleural effusions. Elevation of the left hemidiaphragm. Crowding of the pulmonary vasculature, without frank pulmonary edema. Heart size appears likely enlarged, accentuated by low lung volumes. Aortic atherosclerosis. Upper mediastinal contours are within normal limits. IMPRESSION: 1. Low lung volumes with probable bibasilar subsegmental atelectasis. 2. Elevation of left hemidiaphragm. 3. Aortic atherosclerosis. Electronically Signed   By: Trudie Reedaniel  Entrikin M.D.   On: 05/21/2016 08:44   Labs: Basic Metabolic Panel:  Recent Labs Lab 05/21/16 0245 05/22/16 0546  NA 137 137  K  4.2 4.2  CL 103 101  CO2 26 29  GLUCOSE 114* 131*  BUN 15 12  CREATININE 0.86 0.89  CALCIUM 9.6 9.7   CBC:  Recent Labs Lab 05/21/16 0245 05/22/16 0546  WBC 8.2 6.2  NEUTROABS 6.7  --   HGB 12.3 12.6  HCT 37.6 39.2  MCV 87.2 89.5  PLT 256 287   Cardiac Enzymes:  Recent Labs Lab 05/21/16 0730  TROPONINI 0.03   BNP: BNP (last 3 results)  Recent Labs  05/21/16 0730  BNP 30.2    ProBNP (last 3 results)  Recent Labs  11/02/15 1208  PROBNP 31.0    Signed:  Vassie LollMadera, Jonika Critz MD.  [facility] 05/22/2016, 1:02 PM

## 2016-05-22 NOTE — Progress Notes (Signed)
Physical Therapy Treatment Patient Details Name: Jackie Chandler MRN: [id number] DOB: [birth year] Today's Date: 05/22/2016    History of Present Illness Jackie Chandler is a 82 y.o. female with a Past Medical History of arthritis who presents with fall, physical deconditioning, and LE edema.    PT Comments    Patient is making progress toward mobility goals. Mod A for stair management. Discussed, at length, safe use of AD, mobilizing when at home, and stair management with pt and daughter. Pt will need RW upon d/c and continues to need supervision/assistance 24 hours for safety. Current plan remains appropriate.   Follow Up Recommendations  Home health PT;Supervision/Assistance - 24 hour     Equipment Recommendations  Rolling walker with 5" wheels    Recommendations for Other Services       Precautions / Restrictions Precautions Precautions: Fall Precaution Comments: extremely fearful of falling Restrictions Weight Bearing Restrictions: No    Mobility  Bed Mobility               General bed mobility comments: OOB in chair upon arrival  Transfers Overall transfer level: Needs assistance Equipment used: Rolling walker (2 wheeled) Transfers: Sit to/from Stand Sit to Stand: Min assist         General transfer comment: assist to rise from recliner X2; cues for hand placement and safe use of AD; pt with tendency to sit prematurley  Ambulation/Gait Ambulation/Gait assistance: Min guard Ambulation Distance (Feet): 110 Feet Assistive device: Rolling walker (2 wheeled) Gait Pattern/deviations: Step-through pattern;Decreased stride length;Trunk flexed;Wide base of support;Shuffle     General Gait Details: frequent cues for position of RW, posture, and bilat heel strike; pt with poor foot clearance and flexed trunk   Stairs Stairs: Yes Stairs assistance: Mod assist Stair Management: One rail Left;Forwards (and HHA on R) Number of Stairs: 1 General stair comments:  attempted backward with use of RW but unable to ascend; mod A to ascend one step forward with cues for technique  Wheelchair Mobility    Modified Rankin (Stroke Patients Only)       Balance Overall balance assessment: Needs assistance Sitting-balance support: No upper extremity supported;Feet supported Sitting balance-Leahy Scale: Good     Standing balance support: Bilateral upper extremity supported Standing balance-Leahy Scale: Poor                      Cognition Arousal/Alertness: Awake/alert Behavior During Therapy: WFL for tasks assessed/performed Overall Cognitive Status: Within Functional Limits for tasks assessed                      Exercises      General Comments General comments (skin integrity, edema, etc.): discussed at length stair management, assistance level, and safe use of AD with pt and daughter      Pertinent Vitals/Pain Pain Assessment: Faces Faces Pain Scale: Hurts little more Pain Location: R thigh Pain Descriptors / Indicators: Cramping;Aching Pain Intervention(s): Limited activity within patient's tolerance;Monitored during session;Repositioned    Home Living                      Prior Function            PT Goals (current goals can now be found in the care plan section) Acute Rehab PT Goals Patient Stated Goal: To go home Progress towards PT goals: Progressing toward goals    Frequency  Min 3X/week    PT Plan Current plan remains  appropriate    Co-evaluation             End of Session Equipment Utilized During Treatment: Gait belt Activity Tolerance: Patient tolerated treatment well Patient left: in chair;with call bell/phone within reach;with family/visitor present     Time: 1007-1050 PT Time Calculation (min) (ACUTE ONLY): 43 min  Charges:  $Gait Training: 23-37 mins $Therapeutic Activity: 8-22 mins                    G Codes:      Derek MoundKellyn R Tadao Emig Miciah Shealy, PTA Pager: [phone]  [phone]   05/22/2016, 11:04 AM

## 2016-05-22 NOTE — Telephone Encounter (Signed)
Called pt to set-up TCM hosp f/u no answer LMOM RTC.../lmb 

## 2016-05-23 NOTE — Telephone Encounter (Signed)
Called pt again still no answer LMOM RTC.../lmb 

## 2016-05-26 NOTE — Telephone Encounter (Signed)
Never received call back closing encounter.../lmb 

## 2016-06-13 DIAGNOSIS — R600 Localized edema: Secondary | ICD-10-CM | POA: Diagnosis not present

## 2016-06-13 DIAGNOSIS — Z9181 History of falling: Secondary | ICD-10-CM | POA: Diagnosis not present

## 2016-06-13 DIAGNOSIS — M1991 Primary osteoarthritis, unspecified site: Secondary | ICD-10-CM | POA: Diagnosis not present

## 2016-06-17 ENCOUNTER — Ambulatory Visit (INDEPENDENT_AMBULATORY_CARE_PROVIDER_SITE_OTHER): Payer: Medicare Other | Admitting: Podiatry

## 2016-06-17 ENCOUNTER — Encounter: Payer: Self-pay | Admitting: Podiatry

## 2016-06-17 DIAGNOSIS — Q828 Other specified congenital malformations of skin: Secondary | ICD-10-CM

## 2016-06-17 DIAGNOSIS — R600 Localized edema: Secondary | ICD-10-CM | POA: Diagnosis not present

## 2016-06-17 DIAGNOSIS — B351 Tinea unguium: Secondary | ICD-10-CM | POA: Diagnosis not present

## 2016-06-17 DIAGNOSIS — M79676 Pain in unspecified toe(s): Secondary | ICD-10-CM | POA: Diagnosis not present

## 2016-06-17 DIAGNOSIS — M1991 Primary osteoarthritis, unspecified site: Secondary | ICD-10-CM | POA: Diagnosis not present

## 2016-06-17 DIAGNOSIS — Z9181 History of falling: Secondary | ICD-10-CM | POA: Diagnosis not present

## 2016-06-17 NOTE — Progress Notes (Signed)
Patient ID: Jackie BeamBernice C Mom, female   DOB: 1924/12/25, 82 y.o.   MRN: [id number]  This patient presents today complaining of painful toenails and walking wearing shoes and a painful plantar callus on the right foot. Patient is requesting debridement of nails and the painful callus Her daughter is present in the treatment room today  Objective: Pleasant orientated 3 No open skin lesions bilaterally The toenails are hypertrophic, discolored, incurvated, and tender direct palpation 6-10 Large nucleated plantar keratoses sub-fifth MPJ right  Assessment: Symptomatic onychomycoses 6-10 Porokeratosis 1  Plan: Debrided toenails 10 mechanically and electronically without any bleeding Debride  keratoses 1 without any bleeding  Reappoint 3 months

## 2016-06-19 DIAGNOSIS — M1991 Primary osteoarthritis, unspecified site: Secondary | ICD-10-CM | POA: Diagnosis not present

## 2016-06-19 DIAGNOSIS — R600 Localized edema: Secondary | ICD-10-CM | POA: Diagnosis not present

## 2016-06-19 DIAGNOSIS — Z9181 History of falling: Secondary | ICD-10-CM | POA: Diagnosis not present

## 2016-06-24 DIAGNOSIS — Z9181 History of falling: Secondary | ICD-10-CM | POA: Diagnosis not present

## 2016-06-24 DIAGNOSIS — R600 Localized edema: Secondary | ICD-10-CM | POA: Diagnosis not present

## 2016-06-24 DIAGNOSIS — M1991 Primary osteoarthritis, unspecified site: Secondary | ICD-10-CM | POA: Diagnosis not present

## 2016-06-26 DIAGNOSIS — R600 Localized edema: Secondary | ICD-10-CM | POA: Diagnosis not present

## 2016-06-26 DIAGNOSIS — M1991 Primary osteoarthritis, unspecified site: Secondary | ICD-10-CM | POA: Diagnosis not present

## 2016-06-26 DIAGNOSIS — Z9181 History of falling: Secondary | ICD-10-CM | POA: Diagnosis not present

## 2016-06-27 ENCOUNTER — Telehealth: Payer: Self-pay | Admitting: Emergency Medicine

## 2016-06-27 NOTE — Telephone Encounter (Signed)
Home care wanted to know if they can get extra orders to evaluate and treat as needed for at least 3 more weeks, twice a wk. Nursing to monitor resp. and PT to treat as indicated. Please follow up thanks.

## 2016-07-01 NOTE — Telephone Encounter (Signed)
Called and spoke to [place] and gave verbal orders.

## 2016-07-02 DIAGNOSIS — M1991 Primary osteoarthritis, unspecified site: Secondary | ICD-10-CM | POA: Diagnosis not present

## 2016-07-02 DIAGNOSIS — R600 Localized edema: Secondary | ICD-10-CM | POA: Diagnosis not present

## 2016-07-02 DIAGNOSIS — Z9181 History of falling: Secondary | ICD-10-CM | POA: Diagnosis not present

## 2016-07-04 ENCOUNTER — Telehealth: Payer: Self-pay | Admitting: Internal Medicine

## 2016-07-04 DIAGNOSIS — R600 Localized edema: Secondary | ICD-10-CM | POA: Diagnosis not present

## 2016-07-04 DIAGNOSIS — M1991 Primary osteoarthritis, unspecified site: Secondary | ICD-10-CM | POA: Diagnosis not present

## 2016-07-04 DIAGNOSIS — Z9181 History of falling: Secondary | ICD-10-CM | POA: Diagnosis not present

## 2016-07-04 NOTE — Telephone Encounter (Signed)
Wanted to make Dr. Okey Dupre aware of leg cramps.  She is also having swelling of legs and feet.  Not complaining of any leg pain.  Just complaining of leg crams which has had for a year.  When patient had cramp yesterday went from left thigh and down to left calf.  States cramp is still there and both calf's are still numb.  But does not have any pain or tenderness.  Does not have any medication for muscle spasms.  Has tried OTC medication but has helped some.

## 2016-07-04 NOTE — Telephone Encounter (Signed)
Called and spoke to [place] and gave verbal orders.

## 2016-07-04 NOTE — Telephone Encounter (Signed)
PT eval performed.  Patient at high fall risk.  Recommend feq for 2 x a week for 3 weeks.  Recommend eval for OT for ADL's and requesting update on current diagnosis.  Fax [phone]

## 2016-07-04 NOTE — Telephone Encounter (Signed)
Please advise on any instructions for this patient. Thanks.

## 2016-07-04 NOTE — Telephone Encounter (Signed)
Please have the patient make sure she is adequately hydrated given that she is on furosemide. I would recommend Urgent Care follow up if symptoms worsne or do not improve.

## 2016-07-07 NOTE — Telephone Encounter (Signed)
Called and left message on voice mail to follow up on leg cramps and swelling. Advised patient to stay hydrated and to follow up with us or urgent care if symptoms do not improve.

## 2016-07-08 ENCOUNTER — Telehealth: Payer: Self-pay

## 2016-07-08 DIAGNOSIS — Z9181 History of falling: Secondary | ICD-10-CM | POA: Diagnosis not present

## 2016-07-08 DIAGNOSIS — R600 Localized edema: Secondary | ICD-10-CM | POA: Diagnosis not present

## 2016-07-08 DIAGNOSIS — M1991 Primary osteoarthritis, unspecified site: Secondary | ICD-10-CM | POA: Diagnosis not present

## 2016-07-08 NOTE — Telephone Encounter (Signed)
[facility]   Bp 170/100 -sitting  No SX After 30 mins 130/80  Pt took Lax pill 30mins before PT got there.  IF any questions call Rosanne AshingJim [phone]

## 2016-07-08 NOTE — Telephone Encounter (Signed)
Ok, that is fine

## 2016-07-11 DIAGNOSIS — Z9181 History of falling: Secondary | ICD-10-CM | POA: Diagnosis not present

## 2016-07-11 DIAGNOSIS — R600 Localized edema: Secondary | ICD-10-CM | POA: Diagnosis not present

## 2016-07-11 DIAGNOSIS — M1991 Primary osteoarthritis, unspecified site: Secondary | ICD-10-CM | POA: Diagnosis not present

## 2016-07-14 DIAGNOSIS — R600 Localized edema: Secondary | ICD-10-CM | POA: Diagnosis not present

## 2016-07-14 DIAGNOSIS — M1991 Primary osteoarthritis, unspecified site: Secondary | ICD-10-CM | POA: Diagnosis not present

## 2016-07-14 DIAGNOSIS — Z9181 History of falling: Secondary | ICD-10-CM | POA: Diagnosis not present

## 2016-07-15 DIAGNOSIS — Z9181 History of falling: Secondary | ICD-10-CM | POA: Diagnosis not present

## 2016-07-15 DIAGNOSIS — R600 Localized edema: Secondary | ICD-10-CM | POA: Diagnosis not present

## 2016-07-15 DIAGNOSIS — M1991 Primary osteoarthritis, unspecified site: Secondary | ICD-10-CM | POA: Diagnosis not present

## 2016-07-16 DIAGNOSIS — M1991 Primary osteoarthritis, unspecified site: Secondary | ICD-10-CM | POA: Diagnosis not present

## 2016-07-16 DIAGNOSIS — Z9181 History of falling: Secondary | ICD-10-CM | POA: Diagnosis not present

## 2016-07-16 DIAGNOSIS — R600 Localized edema: Secondary | ICD-10-CM | POA: Diagnosis not present

## 2016-07-17 DIAGNOSIS — M1991 Primary osteoarthritis, unspecified site: Secondary | ICD-10-CM | POA: Diagnosis not present

## 2016-07-17 DIAGNOSIS — Z9181 History of falling: Secondary | ICD-10-CM | POA: Diagnosis not present

## 2016-07-17 DIAGNOSIS — R600 Localized edema: Secondary | ICD-10-CM | POA: Diagnosis not present

## 2016-07-28 DIAGNOSIS — R600 Localized edema: Secondary | ICD-10-CM | POA: Diagnosis not present

## 2016-07-28 DIAGNOSIS — Z9181 History of falling: Secondary | ICD-10-CM | POA: Diagnosis not present

## 2016-07-28 DIAGNOSIS — M1991 Primary osteoarthritis, unspecified site: Secondary | ICD-10-CM | POA: Diagnosis not present

## 2016-07-29 ENCOUNTER — Other Ambulatory Visit: Payer: Self-pay | Admitting: Internal Medicine

## 2016-08-06 ENCOUNTER — Ambulatory Visit (INDEPENDENT_AMBULATORY_CARE_PROVIDER_SITE_OTHER): Payer: BC Managed Care – PPO | Admitting: Internal Medicine

## 2016-08-06 ENCOUNTER — Encounter: Payer: Self-pay | Admitting: Internal Medicine

## 2016-08-06 DIAGNOSIS — R296 Repeated falls: Secondary | ICD-10-CM | POA: Diagnosis not present

## 2016-08-06 DIAGNOSIS — R2681 Unsteadiness on feet: Secondary | ICD-10-CM | POA: Diagnosis not present

## 2016-08-06 DIAGNOSIS — I872 Venous insufficiency (chronic) (peripheral): Secondary | ICD-10-CM | POA: Diagnosis not present

## 2016-08-06 NOTE — Patient Instructions (Signed)
We do not need to change anything today.   Be sure to keep the legs propped up with a couple pillows to let gravity pull out the fluid.  Keep doing the exercises to keep the muscle that you have gotten with therapy.   Fall Prevention in the Home  Falls can cause injuries and can affect people from all age groups. There are many simple things that you can do to make your home safe and to help prevent falls. WHAT CAN I DO ON THE OUTSIDE OF MY HOME?  Regularly repair the edges of walkways and driveways and fix any cracks.  Remove high doorway thresholds.  Trim any shrubbery on the main path into your home.  Use bright outdoor lighting.  Clear walkways of debris and clutter, including tools and rocks.  Regularly check that handrails are securely fastened and in good repair. Both sides of any steps should have handrails.  Install guardrails along the edges of any raised decks or porches.  Have leaves, snow, and ice cleared regularly.  Use sand or salt on walkways during winter months.  In the garage, clean up any spills right away, including grease or oil spills. WHAT CAN I DO IN THE BATHROOM?  Use night lights.  Install grab bars by the toilet and in the tub and shower. Do not use towel bars as grab bars.  Use non-skid mats or decals on the floor of the tub or shower.  If you need to sit down while you are in the shower, use a plastic, non-slip stool.Marland Kitchen.  Keep the floor dry. Immediately clean up any water that spills on the floor.  Remove soap buildup in the tub or shower on a regular basis.  Attach bath mats securely with double-sided non-slip rug tape.  Remove throw rugs and other tripping hazards from the floor. WHAT CAN I DO IN THE BEDROOM?  Use night lights.  Make sure that a bedside light is easy to reach.  Do not use oversized bedding that drapes onto the floor.  Have a firm chair that has side arms to use for getting dressed.  Remove throw rugs and other  tripping hazards from the floor. WHAT CAN I DO IN THE KITCHEN?   Clean up any spills right away.  Avoid walking on wet floors.  Place frequently used items in easy-to-reach places.  If you need to reach for something above you, use a sturdy step stool that has a grab bar.  Keep electrical cables out of the way.  Do not use floor polish or wax that makes floors slippery. If you have to use wax, make sure that it is non-skid floor wax.  Remove throw rugs and other tripping hazards from the floor. WHAT CAN I DO IN THE STAIRWAYS?  Do not leave any items on the stairs.  Make sure that there are handrails on both sides of the stairs. Fix handrails that are broken or loose. Make sure that handrails are as long as the stairways.  Check any carpeting to make sure that it is firmly attached to the stairs. Fix any carpet that is loose or worn.  Avoid having throw rugs at the top or bottom of stairways, or secure the rugs with carpet tape to prevent them from moving.  Make sure that you have a light switch at the top of the stairs and the bottom of the stairs. If you do not have them, have them installed. WHAT ARE SOME OTHER FALL PREVENTION TIPS?  Wear closed-toe shoes that fit well and support your feet. Wear shoes that have rubber soles or low heels.  When you use a stepladder, make sure that it is completely opened and that the sides are firmly locked. Have someone hold the ladder while you are using it. Do not climb a closed stepladder.  Add color or contrast paint or tape to grab bars and handrails in your home. Place contrasting color strips on the first and last steps.  Use mobility aids as needed, such as canes, walkers, scooters, and crutches.  Turn on lights if it is dark. Replace any light bulbs that burn out.  Set up furniture so that there are clear paths. Keep the furniture in the same spot.  Fix any uneven floor surfaces.  Choose a carpet design that does not hide the  edge of steps of a stairway.  Be aware of any and all pets.  Review your medicines with your healthcare provider. Some medicines can cause dizziness or changes in blood pressure, which increase your risk of falling. Talk with your health care provider about other ways that you can decrease your risk of falls. This may include working with a physical therapist or trainer to improve your strength, balance, and endurance.   This information is not intended to replace advice given to you by your health care provider. Make sure you discuss any questions you have with your health care provider.   Document Released: 11/07/2002 Document Revised: 04/03/2015 Document Reviewed: 12/22/2014 Elsevier Interactive Patient Education [employer].

## 2016-08-06 NOTE — Progress Notes (Signed)
   Subjective:    Patient ID: Jackie Chandler, female    DOB: Apr 02, 1925, 82 y.o.   MRN: [id number]  HPI The patient is an 82 YO female coming in for hospital follow up (in hospital in June for fall and extra fluid in her legs, diuresed and given home PT/OT). She is now using a cane at home all the time and training on using a walker. She denies falls since being home although her daughter admits to 1 fall since being home. She denies chest pains, SOB, abdominal pain. Legs are not swelling more and she has gained strength with PT/OT.   [facility], social history reviewed and updated.   Review of Systems  Constitutional: Positive for activity change. Negative for appetite change, chills, fatigue, fever and unexpected weight change.  HENT: Negative.   Eyes: Negative.   Respiratory: Negative for cough, chest tightness, shortness of breath and wheezing.   Cardiovascular: Positive for leg swelling. Negative for chest pain and palpitations.       Stable  Gastrointestinal: Negative for abdominal distention, abdominal pain, blood in stool, constipation, diarrhea and nausea.  Musculoskeletal: Positive for arthralgias and gait problem. Negative for back pain and myalgias.  Skin: Negative.   Neurological: Positive for weakness. Negative for seizures, syncope, facial asymmetry and numbness.  Psychiatric/Behavioral: Negative.       Objective:   Physical Exam  Constitutional: She is oriented to person, place, and time. She appears well-developed and well-nourished.  Overweight  HENT:  Head: Normocephalic and atraumatic.  Right Ear: External ear normal.  Left Ear: External ear normal.  Eyes: EOM are normal.  Neck: Normal range of motion.  Cardiovascular: Normal rate and regular rhythm.   Pulmonary/Chest: Effort normal and breath sounds normal. No respiratory distress. She has no wheezes. She has no rales.  Abdominal: Soft. Bowel sounds are normal. She exhibits no distension. There is no tenderness.  There is no rebound.  Musculoskeletal: She exhibits edema.  1-2+ edema to mid calf, no tenderness, compression hose  Neurological: She is alert and oriented to person, place, and time.  In wheelchair but has cane with her.  Skin: Skin is warm and dry.   Vitals:   08/06/16 0938  BP: 110/60  Pulse: 70  Resp: 12  Temp: 98.4 F (36.9 C)  TempSrc: Oral  SpO2: 96%  Weight: 176 lb (79.8 kg)  Height: 5\' 4"  (1.626 m)      Assessment & Plan:

## 2016-08-06 NOTE — Progress Notes (Signed)
Pre visit review using our clinic review tool, if applicable. No additional management support is needed unless otherwise documented below in the visit note. 

## 2016-08-07 NOTE — Assessment & Plan Note (Signed)
Doing better with PT/OT. Encouraged her to keep up with her exercises even when PT/OT is gone. Encouraged her to use cane/walker all the time.

## 2016-08-07 NOTE — Assessment & Plan Note (Signed)
Wearing compression hose. She does not elevate legs while sitting and talked to her about that need. She is taking 30 mg lasix daily.

## 2016-08-07 NOTE — Assessment & Plan Note (Signed)
1 fall without injury since leaving the hospital. They are working on getting her onto a walker to help with stability. She is using cane 100% of the time. Walks only short distances.

## 2016-08-26 ENCOUNTER — Ambulatory Visit (INDEPENDENT_AMBULATORY_CARE_PROVIDER_SITE_OTHER): Payer: Medicare Other | Admitting: Podiatry

## 2016-08-26 ENCOUNTER — Encounter: Payer: Self-pay | Admitting: Podiatry

## 2016-08-26 DIAGNOSIS — B351 Tinea unguium: Secondary | ICD-10-CM | POA: Diagnosis not present

## 2016-08-26 DIAGNOSIS — Q828 Other specified congenital malformations of skin: Secondary | ICD-10-CM

## 2016-08-26 DIAGNOSIS — M79676 Pain in unspecified toe(s): Secondary | ICD-10-CM | POA: Diagnosis not present

## 2016-08-26 NOTE — Progress Notes (Signed)
Patient ID: Jackie Chandler, female   DOB: 06/28/1925, 82 y.o.   MRN: [id number]  This patient presents today complaining of painful toenails and walking wearing shoes and a painful plantar callus on the right foot. Patient is requesting debridement of nails and the painful callus Her daughter is present in the treatment room today  Objective: Pleasant orientated 3 No open skin lesions bilaterally The toenails are hypertrophic, discolored, incurvated, and tender direct palpation 6-10 Large nucleated plantar keratoses sub-fifth MPJ right  Assessment: Symptomatic onychomycoses 6-10 Porokeratosis 1  Plan: Debrided toenails 10 mechanically and electronically without any bleeding Debride  keratoses 1 without any bleeding  Reappoint 3 months          

## 2016-09-12 ENCOUNTER — Telehealth: Payer: Self-pay | Admitting: Internal Medicine

## 2016-09-12 NOTE — Telephone Encounter (Signed)
Sounds like she needs visit for evaluation and recommendations.

## 2016-09-12 NOTE — Telephone Encounter (Signed)
Pt daughter called in and said that pt is not wanting to walk much and cant left her legs in the bed.  She wants to know what she needs to do?   Jackie Chandler [phone]

## 2016-09-17 ENCOUNTER — Ambulatory Visit: Payer: BC Managed Care – PPO | Admitting: Internal Medicine

## 2016-09-22 ENCOUNTER — Telehealth: Payer: Self-pay | Admitting: Emergency Medicine

## 2016-09-22 NOTE — Telephone Encounter (Signed)
Pts daughter called and asked that you give her a call back. She wants to discuss her mothers medical condition.

## 2016-09-23 ENCOUNTER — Ambulatory Visit: Payer: Medicare Other | Admitting: Podiatry

## 2016-09-23 NOTE — Telephone Encounter (Signed)
Spoke to patient's daughter and patient will not walk using her walker in public since fall. Her daughter is requesting a wheel chair for the patient for times when she takes the patient out. Patient is scared of the walker outside of the home. She is scared she will fall.  The patient will use the walker at home, but will not use it out in public. Please advise if a wheel chair is justifiable for this patient, thanks.

## 2016-09-25 NOTE — Telephone Encounter (Signed)
Rx for wheelchair written and given to Amy to get to the family.

## 2016-09-25 NOTE — Telephone Encounter (Signed)
Jackie Chandler will come pick up. Placed in cabinet up front.

## 2016-09-26 ENCOUNTER — Other Ambulatory Visit: Payer: Self-pay | Admitting: Internal Medicine

## 2016-09-30 ENCOUNTER — Ambulatory Visit: Payer: BC Managed Care – PPO | Admitting: Internal Medicine

## 2016-11-25 ENCOUNTER — Ambulatory Visit: Payer: Medicare Other | Admitting: Podiatry

## 2016-11-26 ENCOUNTER — Other Ambulatory Visit: Payer: Self-pay | Admitting: Internal Medicine

## 2016-12-02 ENCOUNTER — Encounter: Payer: Self-pay | Admitting: Podiatry

## 2016-12-02 ENCOUNTER — Ambulatory Visit (INDEPENDENT_AMBULATORY_CARE_PROVIDER_SITE_OTHER): Payer: Medicare Other | Admitting: Podiatry

## 2016-12-02 VITALS — BP 169/91 | HR 71 | Resp 14

## 2016-12-02 DIAGNOSIS — Q828 Other specified congenital malformations of skin: Secondary | ICD-10-CM

## 2016-12-02 DIAGNOSIS — M79676 Pain in unspecified toe(s): Secondary | ICD-10-CM | POA: Diagnosis not present

## 2016-12-02 DIAGNOSIS — B351 Tinea unguium: Secondary | ICD-10-CM

## 2016-12-02 NOTE — Progress Notes (Signed)
Patient ID: Dicie BeamBernice C Ruhe, female   DOB: Apr 04, 1925, 83 y.o.   MRN: [id number]    This patient presents today complaining of painful toenails and walking wearing shoes and a painful plantar callus on the right foot. Patient is requesting debridement of nails and the painful callus Her daughter is present in the treatment room today  Objective: Pleasant orientated 3 DP and PT pulses 1/4 bilaterally Capillary reflex within normal limits bilaterally Sensation to 10 g monofilament wire intact 3/5 right and 1/5 left Vibratory sensation nonreactive bilaterally Ankle reflex reactive bilaterally No open skin lesions bilaterally The toenails are hypertrophic, discolored, incurvated, and tender direct palpation 6-10 Large nucleated plantar keratoses sub-fifth MPJ right  Assessment: Symptomatic onychomycoses 6-10 Porokeratosis 1  Plan: Debrided toenails 10 mechanically and electronically without any bleeding Debride keratoses 1 without any bleeding  Reappoint 3 months

## 2017-02-02 ENCOUNTER — Other Ambulatory Visit: Payer: Self-pay | Admitting: Internal Medicine

## 2017-03-03 ENCOUNTER — Ambulatory Visit: Payer: Medicare Other | Admitting: Podiatry

## 2017-03-16 ENCOUNTER — Encounter: Payer: Self-pay | Admitting: Internal Medicine

## 2017-03-16 ENCOUNTER — Ambulatory Visit (INDEPENDENT_AMBULATORY_CARE_PROVIDER_SITE_OTHER): Payer: Medicare Other | Admitting: Internal Medicine

## 2017-03-16 DIAGNOSIS — R2681 Unsteadiness on feet: Secondary | ICD-10-CM | POA: Diagnosis not present

## 2017-03-16 MED ORDER — FUROSEMIDE 20 MG PO TABS
30.0000 mg | ORAL_TABLET | Freq: Every day | ORAL | 3 refills | Status: AC
Start: 2017-03-16 — End: ?

## 2017-03-16 NOTE — Progress Notes (Signed)
Pre visit review using our clinic review tool, if applicable. No additional management support is needed unless otherwise documented below in the visit note. 

## 2017-03-16 NOTE — Progress Notes (Signed)
   Subjective:    Patient ID: Jackie Chandler, female    DOB: September 10, 1925, 83 y.o.   MRN: [id number]  HPI The patient is a 83 YO female coming in for several home health needs. Her gait has stabilized since last visit and she denies new falls. She is using walker in the home but is unable to walk longer distances outside the home. She is not steady at night time due to poor vision and has had several near falls.   Review of Systems  Constitutional: Positive for activity change and fatigue. Negative for appetite change, fever and unexpected weight change.  Respiratory: Negative.   Cardiovascular: Negative.   Gastrointestinal: Negative.   Musculoskeletal: Positive for arthralgias and gait problem. Negative for back pain, joint swelling, myalgias and neck pain.  Skin: Negative.   Psychiatric/Behavioral: Negative.       Objective:   Physical Exam  Constitutional: She is oriented to person, place, and time. She appears well-developed and well-nourished.  HENT:  Head: Normocephalic and atraumatic.  Eyes: EOM are normal.  Neck: Normal range of motion.  Cardiovascular: Normal rate and regular rhythm.   Pulmonary/Chest: Effort normal. No respiratory distress. She has no wheezes. She has no rales.  Abdominal: Soft. She exhibits no distension. There is no tenderness. There is no rebound.  Neurological: She is alert and oriented to person, place, and time. Coordination normal.  Skin: Skin is warm and dry.   Vitals:   03/16/17 1354  BP: (!) 160/90  Pulse: 72  Resp: 14  Temp: 97.8 F (36.6 C)  TempSrc: Oral  SpO2: 97%  Weight: 177 lb (80.3 kg)  Height:  (1.626 m)      Assessment & Plan:

## 2017-03-16 NOTE — Patient Instructions (Signed)
We have sent in the refill of the 3 month of the lasix.

## 2017-03-16 NOTE — Assessment & Plan Note (Signed)
Rx for bedside commode, lightweight wheelchair, and pull ups. She is fall free in the last 6 months which is great. We are doing everything we can to support that. She is doing some exercises at home but counseled about increasing this. She denies the need for home PT/OT at this time.

## 2017-04-07 NOTE — Telephone Encounter (Signed)
Daughter called in stating that she has picked up two scripts for patient both stated for a "small wheelchair".  Daughter states script needs to read "transport chair".  Would like to be faxed to [phone] [facility]).  Please follow back up with Phyllis at [phone] once faxed.

## 2017-04-07 NOTE — Telephone Encounter (Signed)
Please advise 

## 2017-04-07 NOTE — Telephone Encounter (Signed)
We have given them scripts for what they requested. I believe that the lightweight wheelchair is justified why can they not fill this rx.

## 2017-04-08 ENCOUNTER — Ambulatory Visit (INDEPENDENT_AMBULATORY_CARE_PROVIDER_SITE_OTHER): Payer: Medicare Other | Admitting: Podiatry

## 2017-04-08 ENCOUNTER — Encounter: Payer: Self-pay | Admitting: Podiatry

## 2017-04-08 DIAGNOSIS — M79676 Pain in unspecified toe(s): Secondary | ICD-10-CM | POA: Diagnosis not present

## 2017-04-08 DIAGNOSIS — B351 Tinea unguium: Secondary | ICD-10-CM

## 2017-04-08 NOTE — Telephone Encounter (Signed)
LVM for Jamesetta Sohyllis to call back in regards, stated in message that PCP believes it should be justified and able to fill for what they need

## 2017-04-08 NOTE — Progress Notes (Signed)
Patient ID: Jackie Chandler, female   DOB: Jun 04, 1925, 83 y.o.   MRN: [id number]    This patient presents today complaining of painful toenails and walking wearing shoes and a painful plantar callus on the right foot. Patient is requesting debridement of nails and the painful callus Her daughter is present in the treatment room today  Objective: Patient transfers to wheelchair to treatment chair Pleasant orientated 3 Peripheral pitting edema bilaterally DP and PT pulses 1/4 bilaterally Capillary reflex within normal limits bilaterally Sensation to 10 g monofilament wire intact 3/5 right and 1/5 left Vibratory sensation nonreactive bilaterally Ankle reflex reactive bilaterally No open skin lesions bilaterally The toenails are hypertrophic, discolored, incurvated, and tender direct palpation 6-10 Atrophic skin with absent. Hammertoe 2-5 bilaterally  Bilaterally Assessment: Symptomatic onychomycoses 6-10 Peripheral neuropathy Decreased pedal pulses suggestive of possible performed toe disease  Plan: Debrided toenails 10 mechanically and electronically without any bleeding Debride keratoses 1 without any bleeding  Reappoint 3 months

## 2017-04-14 NOTE — Telephone Encounter (Signed)
Jackie Chandler was calling [name] back. She states [facility] is [facility] about [facility] to find out what the issues is. She will call office back once she had more information.

## 2017-07-14 ENCOUNTER — Ambulatory Visit (INDEPENDENT_AMBULATORY_CARE_PROVIDER_SITE_OTHER): Payer: Medicare Other | Admitting: Podiatry

## 2017-07-14 ENCOUNTER — Encounter: Payer: Self-pay | Admitting: Podiatry

## 2017-07-14 DIAGNOSIS — M79676 Pain in unspecified toe(s): Secondary | ICD-10-CM

## 2017-07-14 DIAGNOSIS — B351 Tinea unguium: Secondary | ICD-10-CM

## 2017-07-14 DIAGNOSIS — G609 Hereditary and idiopathic neuropathy, unspecified: Secondary | ICD-10-CM | POA: Diagnosis not present

## 2017-07-14 NOTE — Progress Notes (Signed)
Patient ID: Jackie Chandler, female   DOB: 1925/09/15, 83 y.o.   MRN: [id number]    This patient presents today complaining of painful toenails and walking wearing shoes and a painful plantar callus on the right foot. Patient is requesting debridement of nails and the painful callus Her daughter is present in the treatment room today  Objective: Patient transfers to wheelchair to treatment chair Pleasant orientated 3 Peripheral pitting edema bilaterally DP and PT pulses 1/4 bilaterally Capillary reflex within normal limits bilaterally Sensation to 10 g monofilament wire intact 3/5 right and 1/5 left Vibratory sensation nonreactive bilaterally Ankle reflex reactive bilaterally No open skin lesions bilaterally The toenails are hypertrophic, discolored, incurvated, and tender direct palpation 6-10 Atrophic skin with absent. Hair growth bilaterally Surgical scar dorsal right foot Hammertoe 2-5 bilaterally  Bilaterally Assessment: Symptomatic onychomycoses 6-10 Peripheral neuropathy Decreased pedal pulses suggestive of possible performed toe disease  Plan: Debrided toenails 10 mechanically and electronically without any bleeding Debride keratoses 1 without any bleeding  Reappoint 3 months

## 2017-08-10 ENCOUNTER — Telehealth: Payer: Self-pay | Admitting: Internal Medicine

## 2017-08-10 NOTE — Telephone Encounter (Signed)
LVM for patient's daughter to call me back

## 2017-08-10 NOTE — Telephone Encounter (Signed)
Pt's daughter called asking to speak with you regarding some changes that have occurred with the pt. She can be reached at [phone].

## 2017-08-11 NOTE — Telephone Encounter (Signed)
I would recommend visit today or tomorrow or go to the ER to make sure blood counts are not low.

## 2017-08-11 NOTE — Telephone Encounter (Signed)
LVM for patients daughter about trying to make an appointment or go to the ER to make sure Blood counts are not low

## 2017-08-11 NOTE — Telephone Encounter (Signed)
Patients daughter called stating that mother is having loose dark stool everyday. Nothing is helping and has been going on for 2-2 1/2 weeks. Also states mother is walking very little and her legs get really cold "cold as ice" daughter wants to know if she should have her mother make an appointment. Please advise.thanks

## 2017-08-28 ENCOUNTER — Emergency Department (HOSPITAL_COMMUNITY): Payer: Medicare Other

## 2017-08-28 ENCOUNTER — Encounter (HOSPITAL_COMMUNITY): Payer: Self-pay

## 2017-08-28 ENCOUNTER — Inpatient Hospital Stay (HOSPITAL_COMMUNITY)
Admission: EM | Admit: 2017-08-28 | Discharge: 2017-08-31 | DRG: 194 | Disposition: A | Discharge status: Skilled Nursing Facility | Payer: Medicare Other | Attending: Internal Medicine | Admitting: Internal Medicine

## 2017-08-28 DIAGNOSIS — I872 Venous insufficiency (chronic) (peripheral): Secondary | ICD-10-CM | POA: Diagnosis present

## 2017-08-28 DIAGNOSIS — Z880 Allergy status to penicillin: Secondary | ICD-10-CM

## 2017-08-28 DIAGNOSIS — J189 Pneumonia, unspecified organism: Secondary | ICD-10-CM | POA: Diagnosis not present

## 2017-08-28 DIAGNOSIS — Z79899 Other long term (current) drug therapy: Secondary | ICD-10-CM

## 2017-08-28 DIAGNOSIS — K921 Melena: Secondary | ICD-10-CM | POA: Diagnosis present

## 2017-08-28 DIAGNOSIS — Z8349 Family history of other endocrine, nutritional and metabolic diseases: Secondary | ICD-10-CM

## 2017-08-28 DIAGNOSIS — Z23 Encounter for immunization: Secondary | ICD-10-CM

## 2017-08-28 DIAGNOSIS — Z885 Allergy status to narcotic agent status: Secondary | ICD-10-CM

## 2017-08-28 DIAGNOSIS — D649 Anemia, unspecified: Secondary | ICD-10-CM | POA: Diagnosis present

## 2017-08-28 DIAGNOSIS — Z90710 Acquired absence of both cervix and uterus: Secondary | ICD-10-CM

## 2017-08-28 DIAGNOSIS — J181 Lobar pneumonia, unspecified organism: Secondary | ICD-10-CM

## 2017-08-28 DIAGNOSIS — R0600 Dyspnea, unspecified: Secondary | ICD-10-CM

## 2017-08-28 DIAGNOSIS — M1990 Unspecified osteoarthritis, unspecified site: Secondary | ICD-10-CM | POA: Diagnosis present

## 2017-08-28 DIAGNOSIS — Z87891 Personal history of nicotine dependence: Secondary | ICD-10-CM

## 2017-08-28 DIAGNOSIS — D500 Iron deficiency anemia secondary to blood loss (chronic): Secondary | ICD-10-CM | POA: Diagnosis present

## 2017-08-28 DIAGNOSIS — Z888 Allergy status to other drugs, medicaments and biological substances status: Secondary | ICD-10-CM

## 2017-08-28 DIAGNOSIS — E871 Hypo-osmolality and hyponatremia: Secondary | ICD-10-CM | POA: Diagnosis present

## 2017-08-28 DIAGNOSIS — Z8249 Family history of ischemic heart disease and other diseases of the circulatory system: Secondary | ICD-10-CM

## 2017-08-28 DIAGNOSIS — Z833 Family history of diabetes mellitus: Secondary | ICD-10-CM

## 2017-08-28 DIAGNOSIS — R0682 Tachypnea, not elsewhere classified: Secondary | ICD-10-CM | POA: Diagnosis present

## 2017-08-28 DIAGNOSIS — Z66 Do not resuscitate: Secondary | ICD-10-CM | POA: Diagnosis present

## 2017-08-28 DIAGNOSIS — Z7982 Long term (current) use of aspirin: Secondary | ICD-10-CM

## 2017-08-28 DIAGNOSIS — Z9889 Other specified postprocedural states: Secondary | ICD-10-CM

## 2017-08-28 DIAGNOSIS — I5032 Chronic diastolic (congestive) heart failure: Secondary | ICD-10-CM | POA: Diagnosis present

## 2017-08-28 LAB — I-STAT TROPONIN, ED: TROPONIN I, POC: 0.02 ng/mL (ref 0.00–0.08)

## 2017-08-28 MED ORDER — ALBUTEROL SULFATE (2.5 MG/3ML) 0.083% IN NEBU
5.0000 mg | INHALATION_SOLUTION | Freq: Once | RESPIRATORY_TRACT | Status: AC
  Administered 2017-08-28: 5 mg via RESPIRATORY_TRACT
  Filled 2017-08-28: qty 6

## 2017-08-28 NOTE — ED Notes (Signed)
Pt. Returned from [place] via. Stretcher.

## 2017-08-28 NOTE — ED Triage Notes (Signed)
Pt. Brought in by EMS with reports of SOB X3 days. Pt. States today she was not able to catch her breath. Pt. Reports having a productive cough with little sputum that is white. Pt. Has hx. Of CHF. Pt. Presents with pedal edema bilaterally.

## 2017-08-28 NOTE — ED Provider Notes (Signed)
[facility]-EMERGENCY DEPT Provider Note   CSN: [id number] Arrival date & time: 08/28/17  2255     History   Chief Complaint Chief Complaint  Patient presents with  . Shortness of Breath    HPI Jackie Chandler is a 83 y.o. female.  Patient presents to the emergency department by ambulance from home. Patient reports progressively worsening shortness of breath. Symptoms began one week ago. Since that time she has become more and more short of breath, does not require oxygen at home normally. She does have a history of diastolic congestive heart failure. She does feel like she has more swelling in her legs than usual, does not regularly weigh herself. She is not experiencing any chest pain. She does not have any significant cough or fever.      Past Medical History:  Diagnosis Date  . Arthritis     Patient Active Problem List   Diagnosis Date Noted  . Chronic diastolic heart failure (HCC)   . Venous insufficiency   . Frequent falls 01/25/2016  . Gait instability 01/25/2016    Past Surgical History:  Procedure Laterality Date  . ABDOMINAL HYSTERECTOMY    . ABDOMINAL SURGERY    . FOOT SURGERY Right     OB History    No data available       Home Medications    Prior to Admission medications   Medication Sig Start Date End Date Taking? Authorizing Provider  aspirin EC 81 MG tablet Take 81 mg by mouth daily.   Yes [provider]  Chlorpheniramine Maleate (ALLERGY PO) Take 1 tablet by mouth daily.   Yes [provider]  furosemide (LASIX) 20 MG tablet Take 1.5 tablets (30 mg total) by mouth daily. 03/16/17  Yes Myrlene Broker, MD  naproxen sodium (ANAPROX) 220 MG tablet Take 220 mg by mouth daily.   Yes [provider]    Family History Family History  Problem Relation Age of Onset  . Heart disease Father   . Hyperlipidemia Father   . Diabetes Father   . Diabetes Paternal Grandmother   . Arthritis Paternal Grandfather     Social  History Social History  Substance Use Topics  . Smoking status: Current Every Day Smoker    Packs/day: 0.50    Years: 50.00  . Smokeless tobacco: Never Used  . Alcohol use No     Allergies   Penicillins and Codeine   Review of Systems Review of Systems  Respiratory: Positive for shortness of breath.   Cardiovascular: Positive for leg swelling.  All other systems reviewed and are negative.    Physical Exam Updated Vital Signs BP (!) 166/71   Pulse 86   Resp (!) 22   Ht  (1.626 m)   Wt 80.7 kg (178 lb)   SpO2 94%   BMI 30.55 kg/m   Physical Exam  Constitutional: She is oriented to person, place, and time. She appears well-developed and well-nourished. No distress.  HENT:  Head: Normocephalic and atraumatic.  Right Ear: Hearing normal.  Left Ear: Hearing normal.  Nose: Nose normal.  Mouth/Throat: Oropharynx is clear and moist and mucous membranes are normal.  Eyes: Pupils are equal, round, and reactive to light. Conjunctivae and EOM are normal.  Neck: Normal range of motion. Neck supple.  Cardiovascular: Regular rhythm, S1 normal and S2 normal.   Occasional extrasystoles are present. Exam reveals no gallop and no friction rub.   No murmur heard. Pulmonary/Chest: Effort normal. No respiratory distress.  She has decreased breath sounds. She has rhonchi. She has rales. She exhibits no tenderness.  Abdominal: Soft. Normal appearance and bowel sounds are normal. There is no hepatosplenomegaly. There is no tenderness. There is no rebound, no guarding, no tenderness at McBurney's point and negative Murphy's sign. No hernia.  Musculoskeletal: Normal range of motion. She exhibits edema.  Neurological: She is alert and oriented to person, place, and time. She has normal strength. No cranial nerve deficit or sensory deficit. Coordination normal. GCS eye subscore is 4. GCS verbal subscore is 5. GCS motor subscore is 6.  Skin: Skin is warm, dry and intact. No rash noted. No  cyanosis.  Psychiatric: She has a normal mood and affect. Her speech is normal and behavior is normal. Thought content normal.  Nursing note and vitals reviewed.    ED Treatments / Results  Labs (all labs ordered are listed, but only abnormal results are displayed) Labs Reviewed  CBC WITH DIFFERENTIAL/PLATELET - Abnormal; Notable for the following:       Result Value   RBC 3.53 (*)    Hemoglobin 7.1 (*)    HCT 24.8 (*)    MCV 70.3 (*)    MCH 20.1 (*)    MCHC 28.6 (*)    RDW 18.3 (*)    Lymphs Abs 0.6 (*)    All other components within normal limits  COMPREHENSIVE METABOLIC PANEL - Abnormal; Notable for the following:    Sodium 134 (*)    Glucose, Bld 148 (*)    Creatinine, Ser 1.01 (*)    AST 45 (*)    GFR calc non Af Amer 48 (*)    GFR calc Af Amer 55 (*)    All other components within normal limits  POC OCCULT BLOOD, ED - Abnormal; Notable for the following:    Fecal Occult Bld POSITIVE (*)    All other components within normal limits  CULTURE, BLOOD (ROUTINE X 2)  CULTURE, BLOOD (ROUTINE X 2)  URINE CULTURE  BRAIN NATRIURETIC PEPTIDE  URINALYSIS, ROUTINE W REFLEX MICROSCOPIC  I-STAT TROPONIN, ED  I-STAT CG4 LACTIC ACID, ED  TYPE AND SCREEN    EKG  EKG Interpretation  Date/Time:  Friday August 28 2017 23:03:33 EDT Ventricular Rate:  87 PR Interval:    QRS Duration: 101 QT Interval:  374 QTC Calculation: 450 R Axis:   -13 Text Interpretation:  Sinus rhythm RSR' in V1 or V2, probably normal variant LVH with secondary repolarization abnormality Confirmed by Gilda Crease [phone]) on 08/28/2017 11:17:44 PM       Radiology Dg Chest 2 View  Result Date: 08/28/2017 CLINICAL DATA:  83 y/o  F; 2 days of shortness of breath EXAM: CHEST  2 VIEW COMPARISON:  05/21/2016 chest radiograph FINDINGS: Stable mildly enlarged cardiac silhouette. Aortic atherosclerosis with calcification. Consolidation in the right lung base. Probable small bilateral effusions. No  acute osseous abnormality is evident. IMPRESSION: Right lung base consolidation suspicious for pneumonia, less likely asymmetric edema. Probable small effusions. Electronically Signed   By: Mitzi Hansen M.D.   On: 08/28/2017 23:52    Procedures Procedures (including critical care time)  Medications Ordered in ED Medications  cefTRIAXone (ROCEPHIN) 1 g in dextrose 5 % 50 mL IVPB (not administered)  azithromycin (ZITHROMAX) 500 mg in dextrose 5 % 250 mL IVPB (not administered)  albuterol (PROVENTIL) (2.5 MG/3ML) 0.083% nebulizer solution 5 mg (5 mg Nebulization Given 08/28/17 2353)     Initial Impression / Assessment and Plan / ED  Course  I have reviewed the triage vital signs and the nursing notes.  Pertinent labs & imaging results that were available during my care of the patient were reviewed by me and considered in my medical decision making (see chart for details).     Patient presents to the ER for evaluation of shortness of breath. Symptoms began 3 days ago. She has had a cough associated with the shortness of breath, has not had a fever. She does complain of bilateral pedal edema, but this is somewhat chronic for her. She has history of congestive heart failure, does not weigh herself regularly. She is not expressing any chest pain. Oxygen saturations are in the low 90s on 2 L normal cannula, does not normally use oxygen at home. Chest x-ray shows evidence of pneumonia. Blood work, however, does show significant anemia. She has not noticed any bleeding at home but her rectal exam was heme positive. In the ER. Type and screen performed. Patient will require hospitalization for treatment of community-acquired pneumonia in the setting of anemia causing shortness of breath.  Final Clinical Impressions(s) / ED Diagnoses   Final diagnoses:  Community acquired pneumonia of right lower lobe of lung (HCC)  Symptomatic anemia    New Prescriptions New Prescriptions   No  medications on file     Gilda Crease, MD 08/29/17 0140

## 2017-08-28 NOTE — ED Notes (Signed)
Pt. To xray via stretcher

## 2017-08-28 NOTE — ED Notes (Signed)
Nurse drawing labs. 

## 2017-08-29 ENCOUNTER — Encounter (HOSPITAL_COMMUNITY): Payer: Self-pay | Admitting: Family Medicine

## 2017-08-29 ENCOUNTER — Inpatient Hospital Stay (HOSPITAL_COMMUNITY): Payer: Medicare Other

## 2017-08-29 DIAGNOSIS — I5032 Chronic diastolic (congestive) heart failure: Secondary | ICD-10-CM | POA: Diagnosis not present

## 2017-08-29 DIAGNOSIS — Z8249 Family history of ischemic heart disease and other diseases of the circulatory system: Secondary | ICD-10-CM | POA: Diagnosis not present

## 2017-08-29 DIAGNOSIS — M1990 Unspecified osteoarthritis, unspecified site: Secondary | ICD-10-CM | POA: Diagnosis present

## 2017-08-29 DIAGNOSIS — K921 Melena: Secondary | ICD-10-CM | POA: Diagnosis not present

## 2017-08-29 DIAGNOSIS — J181 Lobar pneumonia, unspecified organism: Secondary | ICD-10-CM | POA: Diagnosis not present

## 2017-08-29 DIAGNOSIS — I872 Venous insufficiency (chronic) (peripheral): Secondary | ICD-10-CM | POA: Diagnosis present

## 2017-08-29 DIAGNOSIS — E871 Hypo-osmolality and hyponatremia: Secondary | ICD-10-CM | POA: Diagnosis present

## 2017-08-29 DIAGNOSIS — Z9889 Other specified postprocedural states: Secondary | ICD-10-CM | POA: Diagnosis not present

## 2017-08-29 DIAGNOSIS — I341 Nonrheumatic mitral (valve) prolapse: Secondary | ICD-10-CM | POA: Diagnosis not present

## 2017-08-29 DIAGNOSIS — J189 Pneumonia, unspecified organism: Secondary | ICD-10-CM | POA: Diagnosis present

## 2017-08-29 DIAGNOSIS — Z87891 Personal history of nicotine dependence: Secondary | ICD-10-CM | POA: Diagnosis not present

## 2017-08-29 DIAGNOSIS — Z880 Allergy status to penicillin: Secondary | ICD-10-CM | POA: Diagnosis not present

## 2017-08-29 DIAGNOSIS — Z833 Family history of diabetes mellitus: Secondary | ICD-10-CM | POA: Diagnosis not present

## 2017-08-29 DIAGNOSIS — Z885 Allergy status to narcotic agent status: Secondary | ICD-10-CM | POA: Diagnosis not present

## 2017-08-29 DIAGNOSIS — Z90710 Acquired absence of both cervix and uterus: Secondary | ICD-10-CM | POA: Diagnosis not present

## 2017-08-29 DIAGNOSIS — D649 Anemia, unspecified: Secondary | ICD-10-CM | POA: Diagnosis present

## 2017-08-29 DIAGNOSIS — Z79899 Other long term (current) drug therapy: Secondary | ICD-10-CM | POA: Diagnosis not present

## 2017-08-29 DIAGNOSIS — Z66 Do not resuscitate: Secondary | ICD-10-CM | POA: Diagnosis present

## 2017-08-29 DIAGNOSIS — D500 Iron deficiency anemia secondary to blood loss (chronic): Secondary | ICD-10-CM | POA: Diagnosis present

## 2017-08-29 DIAGNOSIS — Z888 Allergy status to other drugs, medicaments and biological substances status: Secondary | ICD-10-CM | POA: Diagnosis not present

## 2017-08-29 DIAGNOSIS — Z7982 Long term (current) use of aspirin: Secondary | ICD-10-CM | POA: Diagnosis not present

## 2017-08-29 DIAGNOSIS — K922 Gastrointestinal hemorrhage, unspecified: Secondary | ICD-10-CM | POA: Diagnosis not present

## 2017-08-29 DIAGNOSIS — R0682 Tachypnea, not elsewhere classified: Secondary | ICD-10-CM | POA: Diagnosis present

## 2017-08-29 DIAGNOSIS — Z23 Encounter for immunization: Secondary | ICD-10-CM | POA: Diagnosis present

## 2017-08-29 DIAGNOSIS — Z8349 Family history of other endocrine, nutritional and metabolic diseases: Secondary | ICD-10-CM | POA: Diagnosis not present

## 2017-08-29 LAB — CBC WITH DIFFERENTIAL/PLATELET
BASOS PCT: 0 %
Basophils Absolute: 0 10*3/uL (ref 0.0–0.1)
EOS PCT: 1 %
Eosinophils Absolute: 0.1 10*3/uL (ref 0.0–0.7)
HCT: 24.8 % — ABNORMAL LOW (ref 36.0–46.0)
HEMOGLOBIN: 7.1 g/dL — AB (ref 12.0–15.0)
LYMPHS PCT: 9 %
Lymphs Abs: 0.6 10*3/uL — ABNORMAL LOW (ref 0.7–4.0)
MCH: 20.1 pg — AB (ref 26.0–34.0)
MCHC: 28.6 g/dL — AB (ref 30.0–36.0)
MCV: 70.3 fL — AB (ref 78.0–100.0)
MONOS PCT: 12 %
Monocytes Absolute: 0.8 10*3/uL (ref 0.1–1.0)
NEUTROS ABS: 4.9 10*3/uL (ref 1.7–7.7)
Neutrophils Relative %: 78 %
PLATELETS: 332 10*3/uL (ref 150–400)
RBC: 3.53 MIL/uL — ABNORMAL LOW (ref 3.87–5.11)
RDW: 18.3 % — ABNORMAL HIGH (ref 11.5–15.5)
WBC: 6.4 10*3/uL (ref 4.0–10.5)

## 2017-08-29 LAB — URINALYSIS, ROUTINE W REFLEX MICROSCOPIC
BILIRUBIN URINE: NEGATIVE
Glucose, UA: NEGATIVE mg/dL
HGB URINE DIPSTICK: NEGATIVE
KETONES UR: NEGATIVE mg/dL
Leukocytes, UA: NEGATIVE
NITRITE: NEGATIVE
PH: 6 (ref 5.0–8.0)
Protein, ur: NEGATIVE mg/dL
SPECIFIC GRAVITY, URINE: 1.012 (ref 1.005–1.030)

## 2017-08-29 LAB — ABO/RH: ABO/RH(D): AB POS

## 2017-08-29 LAB — COMPREHENSIVE METABOLIC PANEL
ALBUMIN: 3.5 g/dL (ref 3.5–5.0)
ALK PHOS: 56 U/L (ref 38–126)
ALT: 15 U/L (ref 14–54)
AST: 45 U/L — ABNORMAL HIGH (ref 15–41)
Anion gap: 9 (ref 5–15)
BUN: 10 mg/dL (ref 6–20)
CALCIUM: 9 mg/dL (ref 8.9–10.3)
CO2: 24 mmol/L (ref 22–32)
CREATININE: 1.01 mg/dL — AB (ref 0.44–1.00)
Chloride: 101 mmol/L (ref 101–111)
GFR calc non Af Amer: 48 mL/min — ABNORMAL LOW (ref >60)
GFR, EST AFRICAN AMERICAN: 55 mL/min — AB (ref >60)
GLUCOSE: 148 mg/dL — AB (ref 65–99)
Potassium: 3.8 mmol/L (ref 3.5–5.1)
SODIUM: 134 mmol/L — AB (ref 135–145)
Total Bilirubin: 0.3 mg/dL (ref 0.3–1.2)
Total Protein: 6.5 g/dL (ref 6.5–8.1)

## 2017-08-29 LAB — POC OCCULT BLOOD, ED: FECAL OCCULT BLD: POSITIVE — AB

## 2017-08-29 LAB — ECHOCARDIOGRAM COMPLETE
Height: 64 in
WEIGHTICAEL: 2836.8 [oz_av]

## 2017-08-29 LAB — BASIC METABOLIC PANEL
Anion gap: 9 (ref 5–15)
BUN: 8 mg/dL (ref 6–20)
CO2: 26 mmol/L (ref 22–32)
Calcium: 9 mg/dL (ref 8.9–10.3)
Chloride: 104 mmol/L (ref 101–111)
Creatinine, Ser: 0.92 mg/dL (ref 0.44–1.00)
GFR calc Af Amer: >60 mL/min (ref >60)
GFR, EST NON AFRICAN AMERICAN: 53 mL/min — AB (ref >60)
Glucose, Bld: 146 mg/dL — ABNORMAL HIGH (ref 65–99)
POTASSIUM: 2.7 mmol/L — AB (ref 3.5–5.1)
SODIUM: 139 mmol/L (ref 135–145)

## 2017-08-29 LAB — IRON AND TIBC
IRON: 13 ug/dL — AB (ref 28–170)
Saturation Ratios: 4 % — ABNORMAL LOW (ref 10.4–31.8)
TIBC: 363 ug/dL (ref 250–450)
UIBC: 350 ug/dL

## 2017-08-29 LAB — GLUCOSE, CAPILLARY: Glucose-Capillary: 107 mg/dL — ABNORMAL HIGH (ref 65–99)

## 2017-08-29 LAB — VITAMIN B12: Vitamin B-12: 448 pg/mL (ref 180–914)

## 2017-08-29 LAB — FERRITIN: Ferritin: 10 ng/mL — ABNORMAL LOW (ref 11–307)

## 2017-08-29 LAB — RETICULOCYTES
RBC.: 3.27 MIL/uL — ABNORMAL LOW (ref 3.87–5.11)
RETIC COUNT ABSOLUTE: 75.2 10*3/uL (ref 19.0–186.0)
Retic Ct Pct: 2.3 % (ref 0.4–3.1)

## 2017-08-29 LAB — FOLATE: FOLATE: 23.2 ng/mL (ref >5.9)

## 2017-08-29 LAB — STREP PNEUMONIAE URINARY ANTIGEN: STREP PNEUMO URINARY ANTIGEN: NEGATIVE

## 2017-08-29 LAB — HEMOGLOBIN AND HEMATOCRIT, BLOOD
HCT: 27.4 % — ABNORMAL LOW (ref 36.0–46.0)
Hemoglobin: 8.3 g/dL — ABNORMAL LOW (ref 12.0–15.0)

## 2017-08-29 LAB — PREPARE RBC (CROSSMATCH)

## 2017-08-29 LAB — BRAIN NATRIURETIC PEPTIDE: B Natriuretic Peptide: 71.8 pg/mL (ref 0.0–100.0)

## 2017-08-29 LAB — I-STAT CG4 LACTIC ACID, ED: Lactic Acid, Venous: 1.82 mmol/L (ref 0.5–1.9)

## 2017-08-29 MED ORDER — SODIUM CHLORIDE 0.9% FLUSH
3.0000 mL | Freq: Two times a day (BID) | INTRAVENOUS | Status: DC
  Administered 2017-08-29 – 2017-08-31 (×4): 3 mL via INTRAVENOUS

## 2017-08-29 MED ORDER — SODIUM CHLORIDE 0.9 % IV SOLN: 250.0000 mL | INTRAVENOUS | Status: DC | PRN

## 2017-08-29 MED ORDER — HYDROCODONE-ACETAMINOPHEN 5-325 MG PO TABS
1.0000 | ORAL_TABLET | ORAL | Status: DC | PRN
  Administered 2017-08-29: 1 via ORAL
  Administered 2017-08-30 – 2017-08-31 (×3): 2 via ORAL
  Filled 2017-08-29 (×2): qty 2
  Filled 2017-08-29: qty 1
  Filled 2017-08-29: qty 2

## 2017-08-29 MED ORDER — POTASSIUM CHLORIDE CRYS ER 20 MEQ PO TBCR
20.0000 meq | EXTENDED_RELEASE_TABLET | Freq: Once | ORAL | Status: AC
  Administered 2017-08-29: 20 meq via ORAL
  Filled 2017-08-29: qty 1

## 2017-08-29 MED ORDER — DEXTROSE 5 % IV SOLN
1.0000 g | Freq: Once | INTRAVENOUS | Status: AC
  Administered 2017-08-29: 1 g via INTRAVENOUS
  Filled 2017-08-29: qty 10

## 2017-08-29 MED ORDER — ACETAMINOPHEN 650 MG RE SUPP: 650.0000 mg | Freq: Four times a day (QID) | RECTAL | Status: DC | PRN

## 2017-08-29 MED ORDER — PANTOPRAZOLE SODIUM 40 MG IV SOLR
40.0000 mg | Freq: Two times a day (BID) | INTRAVENOUS | Status: DC
  Administered 2017-08-29 – 2017-08-31 (×6): 40 mg via INTRAVENOUS
  Filled 2017-08-29 (×6): qty 40

## 2017-08-29 MED ORDER — ACETAMINOPHEN 325 MG PO TABS: 650.0000 mg | ORAL_TABLET | Freq: Four times a day (QID) | ORAL | Status: DC | PRN

## 2017-08-29 MED ORDER — DEXTROSE 5 % IV SOLN
500.0000 mg | Freq: Once | INTRAVENOUS | Status: AC
  Administered 2017-08-29: 500 mg via INTRAVENOUS
  Filled 2017-08-29: qty 500

## 2017-08-29 MED ORDER — ONDANSETRON HCL 4 MG/2ML IJ SOLN: 4.0000 mg | Freq: Four times a day (QID) | INTRAMUSCULAR | Status: DC | PRN

## 2017-08-29 MED ORDER — POTASSIUM CHLORIDE 10 MEQ/100ML IV SOLN
10.0000 meq | INTRAVENOUS | Status: AC
  Administered 2017-08-29 (×3): 10 meq via INTRAVENOUS
  Filled 2017-08-29 (×3): qty 100

## 2017-08-29 MED ORDER — SODIUM CHLORIDE 0.9% FLUSH: 3.0000 mL | INTRAVENOUS | Status: DC | PRN

## 2017-08-29 MED ORDER — ALBUTEROL SULFATE (2.5 MG/3ML) 0.083% IN NEBU: 2.5000 mg | INHALATION_SOLUTION | RESPIRATORY_TRACT | Status: DC | PRN

## 2017-08-29 MED ORDER — DEXTROSE 5 % IV SOLN
500.0000 mg | INTRAVENOUS | Status: DC
  Administered 2017-08-30 – 2017-08-31 (×2): 500 mg via INTRAVENOUS
  Filled 2017-08-29 (×2): qty 500

## 2017-08-29 MED ORDER — ONDANSETRON HCL 4 MG PO TABS: 4.0000 mg | ORAL_TABLET | Freq: Four times a day (QID) | ORAL | Status: DC | PRN

## 2017-08-29 MED ORDER — MAGNESIUM CHLORIDE 64 MG PO TBEC
1.0000 | DELAYED_RELEASE_TABLET | Freq: Once | ORAL | Status: AC
  Administered 2017-08-29: 64 mg via ORAL
  Filled 2017-08-29: qty 1

## 2017-08-29 MED ORDER — SODIUM CHLORIDE 0.9 % IV SOLN
Freq: Once | INTRAVENOUS | Status: AC
Start: 2017-08-29 — End: 2017-08-29
  Administered 2017-08-29: 04:00:00 via INTRAVENOUS

## 2017-08-29 MED ORDER — DEXTROSE 5 % IV SOLN
1.0000 g | INTRAVENOUS | Status: DC
  Administered 2017-08-30 (×2): 1 g via INTRAVENOUS
  Filled 2017-08-29 (×2): qty 10

## 2017-08-29 NOTE — ED Notes (Signed)
Attempted to give report to RN. Will call back on provided number

## 2017-08-29 NOTE — Consult Note (Signed)
Referring Provider: Dr. Hanley Ben Primary Care Physician:  Myrlene Broker, MD Primary Gastroenterologist:  Gentry Fitz  Reason for Consultation:  GI bleed; Anemia  HPI: Jackie Chandler is a 83 y.o. female with a history of CHF who came to the hospital because she was short of breath and had a productive cough. She was diagnosed with pneumonia and found to have be anemic with a Hgb 7.1, MCV 70. She has been having black stools intermittently for months that she attributes to times when she is constipated. Reports having diarrhea about 2 weeks ago and being given Imodium. Denies abdominal pain, N/V/dizziness. Denies history of ulcers and denies ever having an EGD or colonoscopy. Hgb 8.3 after transfusion of one unit of PRBCs. Sitting in bedside chair tolerating clear liquid diet.  Past Medical History:  Diagnosis Date  . Arthritis   CHF  Past Surgical History:  Procedure Laterality Date  . ABDOMINAL HYSTERECTOMY    . ABDOMINAL SURGERY    . FOOT SURGERY Right     Prior to Admission medications   Medication Sig Start Date End Date Taking? Authorizing Provider  aspirin EC 81 MG tablet Take 81 mg by mouth daily.   Yes [provider]  Chlorpheniramine Maleate (ALLERGY PO) Take 1 tablet by mouth daily.   Yes [provider]  furosemide (LASIX) 20 MG tablet Take 1.5 tablets (30 mg total) by mouth daily. 03/16/17  Yes Myrlene Broker, MD  naproxen sodium (ANAPROX) 220 MG tablet Take 220 mg by mouth daily.   Yes [provider]    Scheduled Meds: . pantoprazole (PROTONIX) IV  40 mg Intravenous Q12H  . sodium chloride flush  3 mL Intravenous Q12H   Continuous Infusions: . sodium chloride    . [START ON 08/30/2017] azithromycin    . [START ON 08/30/2017] cefTRIAXone (ROCEPHIN)  IV     PRN Meds:.sodium chloride, acetaminophen **OR** acetaminophen, albuterol, HYDROcodone-acetaminophen, ondansetron **OR** ondansetron (ZOFRAN) IV, sodium chloride  flush  Allergies as of 08/28/2017 - Review Complete 08/28/2017  Allergen Reaction Noted  . Penicillins Itching 11/08/2014  . Codeine Other (See Comments) 11/08/2014    Family History  Problem Relation Age of Onset  . Heart disease Father   . Hyperlipidemia Father   . Diabetes Father   . Diabetes Paternal Grandmother   . Arthritis Paternal Grandfather     Social History   Social History  . Marital status: Widowed    Spouse name: N/A  . Number of children: N/A  . Years of education: N/A   Occupational History  . Not on file.   Social History Main Topics  . Smoking status: Current Every Day Smoker    Packs/day: 0.50    Years: 50.00  . Smokeless tobacco: Never Used  . Alcohol use No  . Drug use: No  . Sexual activity: No   Other Topics Concern  . Not on file   Social History Narrative  . No narrative on file    Review of Systems: All negative except as stated above in HPI.  Physical Exam: Vital signs: Vitals:   08/29/17 0713 08/29/17 1518  BP: (!) 146/52 (!) 161/80  Pulse: 79 79  Resp: (!) 21 18  Temp: 98.2 F (36.8 C) 98.2 F (36.8 C)  SpO2: 97% 98%   Last BM Date: 08/28/17 General:   Alert,  Well-developed, well-nourished, pleasant and cooperative in NAD, elderly Head: normocephalic, atraumatic Eyes: anicteric sclera ENT: oropharynx clear Neck: supple, nontender Lungs:  Clear throughout to  auscultation.   No wheezes, crackles, or rhonchi. No acute distress. Heart:  Regular rate and rhythm; no murmurs, clicks, rubs,  or gallops. Abdomen: soft, nontender, nondistended, +BS  Rectal:  Deferred Ext: 1+ bilateral LE edema  GI:  Lab Results:  Recent Labs  08/28/17 2317 08/29/17 0920  WBC 6.4  --   HGB 7.1* 8.3*  HCT 24.8* 27.4*  PLT 332  --    BMET  Recent Labs  08/28/17 2317 08/29/17 0352  NA 134* 139  K 3.8 2.7*  CL 101 104  CO2 24 26  GLUCOSE 148* 146*  BUN 10 8  CREATININE 1.01* 0.92  CALCIUM 9.0 9.0   LFT  Recent Labs   08/28/17 2317  PROT 6.5  ALBUMIN 3.5  AST 45*  ALT 15  ALKPHOS 56  BILITOT 0.3   PT/INR No results for input(s): LABPROT, INR in the last 72 hours.   Studies/Results: Dg Chest 2 View  Result Date: 08/28/2017 CLINICAL DATA:  83 y/o  F; 2 days of shortness of breath EXAM: CHEST  2 VIEW COMPARISON:  05/21/2016 chest radiograph FINDINGS: Stable mildly enlarged cardiac silhouette. Aortic atherosclerosis with calcification. Consolidation in the right lung base. Probable small bilateral effusions. No acute osseous abnormality is evident. IMPRESSION: Right lung base consolidation suspicious for pneumonia, less likely asymmetric edema. Probable small effusions. Electronically Signed   By: Mitzi Hansen M.D.   On: 08/28/2017 23:52    Impression/Plan: 83 yo with melenic stools and symptomatic anemia. Melena has been chronic and hemodynamically stable with anemia. Shortness of breath has improved since admission. On IV Abx. Hgb 8.3 following blood transfusion (7.1 on admit). Continue Protonix 40 mg IV Q 12 hours. Would manage conservatively and not do any invasive GI procedures due to her advanced age. Ok to advance diet tomorrow if H/H stable. Will follow.    LOS: 0 days   Aryani Daffern C.  08/29/2017, 5:43 PM  Pager [phone]  AFTER 5 pm or on weekends please call [phone]

## 2017-08-29 NOTE — H&P (Signed)
History and Physical    Jackie Chandler OZH:[id number] DOB: 1924/12/24 DOA: 08/28/2017  PCP: Myrlene Broker, MD   Patient coming from: Home  Chief Complaint: DOE, productive cough  HPI: Jackie Chandler is a 83 y.o. female with medical history significant for chronic diastolic CHF, now presenting to the emergency department for evaluation of shortness of breath and productive cough. Patient reports that she been in her usual state of health until approximately 2 weeks ago when she developed loose stools that have been very dark. She had otherwise remained well with no significant abdominal pain and no particular dyspnea or cough. She then went on to develop a nonproductive cough approximately one week ago, becoming productive of white sputum approximately 3 days ago, and with progressive dyspnea on exertion, and now to the point where she is dyspneic while at rest. She denies fevers or chills and denies chest pain or palpitations. No vomiting. She has never had dark stool like this previously. She reports taking Aleve for her arthritis and also takes a daily aspirin 81 mg. No syncope. No headache.   ED Course: Upon arrival to the ED, patient is found to be saturating low to mid 90s on 2 L/m supplemental oxygen, slightly tachypneic, and with vitals otherwise stable. EKG features a sinus rhythm and chest x-ray is notable for a right basilar pneumonia. Chemistry panel features a slight hyponatremia, and slight elevation in AST. CBC is notable for a microcytic anemia with hemoglobin of 7.1, down from 12.6 a year ago. MCV is 70.3, having been 89.5 a year ago. Lactic acid is reassuring at 1.8, troponin and BNP are within normal limits, and urinalysis is unremarkable. Type and screen was performed, urine was sent for culture, and patient was treated with Rocephin and azithromycin in the ED. She remained hemodynamically stable and has not been in acute respiratory distress. She will be admitted to the  medical surgical unit for ongoing evaluation and management of productive cough, melena, and exertional dyspnea secondary to community acquired pneumonia and GI bleeding.   Review of Systems:  All other systems reviewed and apart from HPI, are negative.  Past Medical History:  Diagnosis Date  . Arthritis     Past Surgical History:  Procedure Laterality Date  . ABDOMINAL HYSTERECTOMY    . ABDOMINAL SURGERY    . FOOT SURGERY Right      reports that she has been smoking.  She has a 25.00 pack-year smoking history. She has never used smokeless tobacco. She reports that she does not drink alcohol or use drugs.  Allergies  Allergen Reactions  . Penicillins Itching  . Codeine Other (See Comments)    Makes nervous     Family History  Problem Relation Age of Onset  . Heart disease Father   . Hyperlipidemia Father   . Diabetes Father   . Diabetes Paternal Grandmother   . Arthritis Paternal Grandfather      Prior to Admission medications   Medication Sig Start Date End Date Taking? Authorizing Provider  aspirin EC 81 MG tablet Take 81 mg by mouth daily.   Yes [provider]  Chlorpheniramine Maleate (ALLERGY PO) Take 1 tablet by mouth daily.   Yes [provider]  furosemide (LASIX) 20 MG tablet Take 1.5 tablets (30 mg total) by mouth daily. 03/16/17  Yes Myrlene Broker, MD  naproxen sodium (ANAPROX) 220 MG tablet Take 220 mg by mouth daily.   Yes [provider]    Physical  Exam: Vitals:   08/29/17 0015 08/29/17 0030 08/29/17 0100 08/29/17 0115  BP: (!) 144/61 (!) 151/70 (!) 159/78 (!) 166/71  Pulse: 87 81 86 86  Resp: (!) 23 18 (!) 21 (!) 22  SpO2: 99% 94% 93% 94%  Weight:      Height:          Constitutional: NAD, calm, appears uncomfortable  Eyes: PERTLA, lids and conjunctivae normal ENMT: Mucous membranes are moist. Posterior pharynx clear of any exudate or lesions.   Neck: normal, supple, no masses, no thyromegaly Respiratory:  Slight tachypnea, course rales at right base, no wheezing, no crackles. No accessory muscle use.  Cardiovascular: S1 & S2 heard, regular rate and rhythm. No significant JVD. Abdomen: No distension, no tenderness, no masses palpated. Bowel sounds normal.  Musculoskeletal: no clubbing / cyanosis. No joint deformity upper and lower extremities.   Skin: no significant rashes, lesions, ulcers. Warm, dry, well-perfused. Neurologic: CN 2-12 grossly intact. Sensation intact. Strength 5/5 in all 4 limbs.  Psychiatric: Alert and oriented x 3. Calm, cooperative     Labs on Admission: I have personally reviewed following labs and imaging studies  CBC:  Recent Labs Lab 08/28/17 2317  WBC 6.4  NEUTROABS 4.9  HGB 7.1*  HCT 24.8*  MCV 70.3*  PLT 332   Basic Metabolic Panel:  Recent Labs Lab 08/28/17 2317  NA 134*  K 3.8  CL 101  CO2 24  GLUCOSE 148*  BUN 10  CREATININE 1.01*  CALCIUM 9.0   GFR: Estimated Creatinine Clearance: 38 mL/min (A) (by C-G formula based on SCr of 1.01 mg/dL (H)). Liver Function Tests:  Recent Labs Lab 08/28/17 2317  AST 45*  ALT 15  ALKPHOS 56  BILITOT 0.3  PROT 6.5  ALBUMIN 3.5   No results for input(s): LIPASE, AMYLASE in the last 168 hours. No results for input(s): AMMONIA in the last 168 hours. Coagulation Profile: No results for input(s): INR, PROTIME in the last 168 hours. Cardiac Enzymes: No results for input(s): CKTOTAL, CKMB, CKMBINDEX, TROPONINI in the last 168 hours. BNP (last 3 results) No results for input(s): PROBNP in the last 8760 hours. HbA1C: No results for input(s): HGBA1C in the last 72 hours. CBG: No results for input(s): GLUCAP in the last 168 hours. Lipid Profile: No results for input(s): CHOL, HDL, LDLCALC, TRIG, CHOLHDL, LDLDIRECT in the last 72 hours. Thyroid Function Tests: No results for input(s): TSH, T4TOTAL, FREET4, T3FREE, THYROIDAB in the last 72 hours. Anemia Panel: No results for input(s): VITAMINB12,  FOLATE, FERRITIN, TIBC, IRON, RETICCTPCT in the last 72 hours. Urine analysis:    Component Value Date/Time   COLORURINE YELLOW 08/29/2017 0036   APPEARANCEUR CLEAR 08/29/2017 0036   LABSPEC 1.012 08/29/2017 0036   PHURINE 6.0 08/29/2017 0036   GLUCOSEU NEGATIVE 08/29/2017 0036   HGBUR NEGATIVE 08/29/2017 0036   BILIRUBINUR NEGATIVE 08/29/2017 0036   KETONESUR NEGATIVE 08/29/2017 0036   PROTEINUR NEGATIVE 08/29/2017 0036   NITRITE NEGATIVE 08/29/2017 0036   LEUKOCYTESUR NEGATIVE 08/29/2017 0036   Sepsis Labs: (procalcitonin:4,lacticidven:4) )No results found for this or any previous visit (from the past 240 hour(s)).   Radiological Exams on Admission: Dg Chest 2 View  Result Date: 08/28/2017 CLINICAL DATA:  83 y/o  F; 2 days of shortness of breath EXAM: CHEST  2 VIEW COMPARISON:  05/21/2016 chest radiograph FINDINGS: Stable mildly enlarged cardiac silhouette. Aortic atherosclerosis with calcification. Consolidation in the right lung base. Probable small bilateral effusions. No acute osseous abnormality is evident. IMPRESSION:  Right lung base consolidation suspicious for pneumonia, less likely asymmetric edema. Probable small effusions. Electronically Signed   By: Mitzi Hansen M.D.   On: 08/28/2017 23:52    EKG: Independently reviewed. Sinus rhythm, RSR' in V2.   Assessment/Plan  1. Symptomatic anemia, melena  - Pt presents with dyspnea, lethargy , 1 week of dark stool  - Found to have Hgb 7.1, down from normal range on last CBC a year ago  - She is hemodynamically stable  - Using Aleve at home, not on H2-blocker or PPI  - 1 units transfusing in ED  - Plan to treat with IV Protonix, check post-transfusion H&H, avoid NSAID's, hold ASA 81   2. CAP  - Pt noted to have new supplemental O2 requirement, reports 1 wk of productive cough  - CXR findings suggest a RLL pneumonia  - Blood cultures collected in ED and empiric Rocephin and azithromycin given   - Plan  to check sputum culture, strep pneumo and legionella antigens, continue Rocephin and azithromycin, continue supplemental O2 prn   3. Chronic diastolic CHF  - Pt appears roughly euvolemic on admission  - Hold Lasix in setting of loose-stools and NPO status, follow daily wts and I/O's   DVT prophylaxis: SCD's Code Status: Full  Family Communication: Daughter updated at bedside Disposition Plan: Admit to med-surg Consults called: None Admission status: Inpatient    Briscoe Deutscher, MD [facility] Pager [phone]  If 7PM-7AM, please contact night-coverage [email or web address] Password [email or web address]  08/29/2017, 1:54 AM

## 2017-08-29 NOTE — Progress Notes (Signed)
Critical lab result of K+ 2.7 relayed to Dr Antionette Char.

## 2017-08-29 NOTE — Progress Notes (Signed)
  Echocardiogram 2D Echocardiogram has been performed.  Jackie Chandler T Jackie Chandler 08/29/2017, 11:51 AM

## 2017-08-29 NOTE — Progress Notes (Signed)
Patient ID: Jackie Chandler, female   DOB: [birth year], 83 y.o.   MRN: [id number] Patient was admitted early this morning for community acquired pneumonia and anemia probably secondary to GI bleed. She was started on antibiotics and IV Protonix. I reviewed the medical records myself including the history and physical from this morning. I examined her at bedside and explained the plan of care.I have spoken to on call GI/Dr. Bosie Clos on phone who will see the patient in consultation. Continue IV antibiotics and IV Protonix. Repeat a.m. labs

## 2017-08-29 NOTE — Progress Notes (Signed)
Call to Operator to page cardiovascular tech about the [facility] ordered, he will page her.

## 2017-08-29 NOTE — Progress Notes (Signed)
Called and left message on SW about consult, ? Placement.

## 2017-08-30 DIAGNOSIS — K921 Melena: Secondary | ICD-10-CM

## 2017-08-30 DIAGNOSIS — J181 Lobar pneumonia, unspecified organism: Secondary | ICD-10-CM

## 2017-08-30 DIAGNOSIS — K922 Gastrointestinal hemorrhage, unspecified: Secondary | ICD-10-CM

## 2017-08-30 DIAGNOSIS — I5032 Chronic diastolic (congestive) heart failure: Secondary | ICD-10-CM

## 2017-08-30 LAB — TYPE AND SCREEN
ABO/RH(D): AB POS
Antibody Screen: NEGATIVE
UNIT DIVISION: 0

## 2017-08-30 LAB — BASIC METABOLIC PANEL
Anion gap: 6 (ref 5–15)
CALCIUM: 9 mg/dL (ref 8.9–10.3)
CO2: 27 mmol/L (ref 22–32)
CREATININE: 0.78 mg/dL (ref 0.44–1.00)
Chloride: 104 mmol/L (ref 101–111)
GFR calc non Af Amer: >60 mL/min (ref >60)
GLUCOSE: 127 mg/dL — AB (ref 65–99)
Potassium: 3.4 mmol/L — ABNORMAL LOW (ref 3.5–5.1)
Sodium: 137 mmol/L (ref 135–145)

## 2017-08-30 LAB — CBC WITH DIFFERENTIAL/PLATELET
BASOS ABS: 0 10*3/uL (ref 0.0–0.1)
Basophils Relative: 0 %
Eosinophils Absolute: 0.1 10*3/uL (ref 0.0–0.7)
Eosinophils Relative: 1 %
HCT: 28.3 % — ABNORMAL LOW (ref 36.0–46.0)
Hemoglobin: 8.6 g/dL — ABNORMAL LOW (ref 12.0–15.0)
LYMPHS ABS: 1.1 10*3/uL (ref 0.7–4.0)
Lymphocytes Relative: 15 %
MCH: 22.1 pg — AB (ref 26.0–34.0)
MCHC: 30.4 g/dL (ref 30.0–36.0)
MCV: 72.8 fL — ABNORMAL LOW (ref 78.0–100.0)
MONOS PCT: 11 %
Monocytes Absolute: 0.8 10*3/uL (ref 0.1–1.0)
NEUTROS ABS: 5.4 10*3/uL (ref 1.7–7.7)
Neutrophils Relative %: 73 %
Platelets: 384 10*3/uL (ref 150–400)
RBC: 3.89 MIL/uL (ref 3.87–5.11)
RDW: 19.2 % — AB (ref 11.5–15.5)
WBC: 7.4 10*3/uL (ref 4.0–10.5)

## 2017-08-30 LAB — LEGIONELLA PNEUMOPHILA SEROGP 1 UR AG: L. pneumophila Serogp 1 Ur Ag: NEGATIVE

## 2017-08-30 LAB — GLUCOSE, CAPILLARY: Glucose-Capillary: 140 mg/dL — ABNORMAL HIGH (ref 65–99)

## 2017-08-30 LAB — BPAM RBC
Blood Product Expiration Date: 201810042359
ISSUE DATE / TIME: 201809290330
UNIT TYPE AND RH: 8400

## 2017-08-30 LAB — URINE CULTURE: Culture: NO GROWTH

## 2017-08-30 LAB — MAGNESIUM: Magnesium: 2.1 mg/dL (ref 1.7–2.4)

## 2017-08-30 MED ORDER — PNEUMOCOCCAL VAC POLYVALENT 25 MCG/0.5ML IJ INJ
0.5000 mL | INJECTION | INTRAMUSCULAR | Status: AC
  Administered 2017-08-31: 0.5 mL via INTRAMUSCULAR
  Filled 2017-08-30: qty 0.5

## 2017-08-30 MED ORDER — FUROSEMIDE 20 MG PO TABS
30.0000 mg | ORAL_TABLET | Freq: Every day | ORAL | Status: DC
  Administered 2017-08-30 – 2017-08-31 (×2): 30 mg via ORAL
  Filled 2017-08-30 (×2): qty 2

## 2017-08-30 MED ORDER — POTASSIUM CHLORIDE CRYS ER 20 MEQ PO TBCR
40.0000 meq | EXTENDED_RELEASE_TABLET | Freq: Once | ORAL | Status: AC
  Administered 2017-08-30: 40 meq via ORAL
  Filled 2017-08-30: qty 2

## 2017-08-30 MED ORDER — INFLUENZA VAC SPLIT HIGH-DOSE 0.5 ML IM SUSY: 0.5000 mL | PREFILLED_SYRINGE | INTRAMUSCULAR | Status: DC | PRN

## 2017-08-30 NOTE — Progress Notes (Signed)
Patient ID: Jackie Chandler, female   DOB: 1925/09/29, 83 y.o.   MRN: [id number]  PROGRESS NOTE    Jackie Chandler  WUJ:[id number] DOB: 1924/12/22 DOA: 08/28/2017 PCP: Myrlene Broker, MD   Brief Narrative:  83 year old female with history of chronic diastolic CHF presented with cough and dyspnea on exertion. She was found to have pneumonia and probable GI bleed with hemoglobin of 7.1. She was admitted on IV antibiotics; one unit packed red cells was transfused. GI was consulted.  Assessment & Plan:   Principal Problem:   Symptomatic anemia Active Problems:   Chronic diastolic heart failure (HCC)   Venous insufficiency   Melena   CAP (community acquired pneumonia)   1. Probable upper GI bleeding presenting with melena and symptomatic anemia - Status post 1 unit packed red cells transfusion on 08/29/2017. Hemoglobin 8.6 today. GI evaluation appreciated. GI recommends conservative medical management. Continue Protonix twice a day and switch to oral at the time of discharge. Repeat hemoglobin in a.m. avoid NSAIDs  2. CAP  - Continue Rocephin and Zithromax. Cultures negative so far. Repeat chest x-ray for tomorrow. Oxygen supplementation as needed  3. Chronic diastolic CHF  -Restart Lasix. Outpatient follow-up with primary care provider. Patient might need cardiology evaluation as an outpatient    DVT prophylaxis: SCDs Code Status:  Full Family Communication: Discussed with daughter at bedside Disposition Plan: Home in 1-2 days if clinically improved  Consultants: GI  Procedures: None  Antimicrobials: Rocephin and Zithromax from 08/28/2017   Subjective: Patient seen and examined at bedside. She denies any overnight fever, nausea or vomiting. Denies any overnight black or bloody stools  Objective: Vitals:   08/29/17 0800 08/29/17 1518 08/29/17 2034 08/30/17 0631  BP:  (!) 161/80 (!) 169/74 (!) 152/65  Pulse:  79 84 87  Resp:  18 19   Temp:  98.2 F (36.8 C) 98.6  F (37 C) 98.6 F (37 C)  TempSrc:  Oral Oral   SpO2:  98% 97% 99%  Weight: 80.4 kg (177 lb 4.8 oz)     Height:  (1.626 m)       Intake/Output Summary (Last 24 hours) at 08/30/17 1230 Last data filed at 08/30/17 0834  Gross per 24 hour  Intake             2332 ml  Output              750 ml  Net             1582 ml   Filed Weights   08/28/17 2307 08/29/17 0306 08/29/17 0800  Weight: 80.7 kg (178 lb) 82.1 kg (181 lb) 80.4 kg (177 lb 4.8 oz)    Examination:  General exam: Appears calm and comfortable  Respiratory system: Bilateral decreased breath sound at bases With some scattered crackles Cardiovascular system: S1 & S2 heard, rate controlled  Gastrointestinal system: Abdomen is nondistended, soft and nontender. Normal bowel sounds heard. Extremities: No cyanosis, clubbing; 1-2+ pitting pedal edema   Data Reviewed: I have personally reviewed following labs and imaging studies  CBC:  Recent Labs Lab 08/28/17 2317 08/29/17 0920 08/30/17 0257  WBC 6.4  --  7.4  NEUTROABS 4.9  --  5.4  HGB 7.1* 8.3* 8.6*  HCT 24.8* 27.4* 28.3*  MCV 70.3*  --  72.8*  PLT 332  --  384   Basic Metabolic Panel:  Recent Labs Lab 08/28/17 2317 08/29/17 0352 08/30/17 0257  NA 134* 139 137  K 3.8 2.7* 3.4*  CL 101 104 104  CO2 GLUCOSE 148* 146* 127*  BUN 10 8 <5*  CREATININE 1.01* 0.92 0.78  CALCIUM 9.0 9.0 9.0  MG  --   --  2.1   GFR: Estimated Creatinine Clearance: 48 mL/min (by C-G formula based on SCr of 0.78 mg/dL). Liver Function Tests:  Recent Labs Lab 08/28/17 2317  AST 45*  ALT 15  ALKPHOS 56  BILITOT 0.3  PROT 6.5  ALBUMIN 3.5   No results for input(s): LIPASE, AMYLASE in the last 168 hours. No results for input(s): AMMONIA in the last 168 hours. Coagulation Profile: No results for input(s): INR, PROTIME in the last 168 hours. Cardiac Enzymes: No results for input(s): CKTOTAL, CKMB, CKMBINDEX, TROPONINI in the last 168 hours. BNP (last 3  results) No results for input(s): PROBNP in the last 8760 hours. HbA1C: No results for input(s): HGBA1C in the last 72 hours. CBG:  Recent Labs Lab 08/29/17 0853 08/30/17 0751  GLUCAP 107* 140*   Lipid Profile: No results for input(s): CHOL, HDL, LDLCALC, TRIG, CHOLHDL, LDLDIRECT in the last 72 hours. Thyroid Function Tests: No results for input(s): TSH, T4TOTAL, FREET4, T3FREE, THYROIDAB in the last 72 hours. Anemia Panel:  Recent Labs  08/29/17 0352  VITAMINB12 448  FOLATE 23.2  FERRITIN 10*  TIBC 363  IRON 13*  RETICCTPCT 2.3   Sepsis Labs:  Recent Labs Lab 08/29/17 0106  LATICACIDVEN 1.82    Recent Results (from the past 240 hour(s))  Urine culture     Status: None   Collection Time: 08/29/17 12:35 AM  Result Value Ref Range Status   Specimen Description IN/OUT CATH URINE  Final   Special Requests NONE  Final   Culture NO GROWTH  Final   Report Status 08/30/2017 FINAL  Final  Culture, blood (Routine X 2) w Reflex to ID Panel     Status: None (Preliminary result)   Collection Time: 08/29/17  1:05 AM  Result Value Ref Range Status   Specimen Description BLOOD LEFT HAND  Final   Special Requests   Final    BOTTLES DRAWN AEROBIC ONLY Blood Culture adequate volume   Culture PENDING  Incomplete   Report Status PENDING  Incomplete         Radiology Studies: Dg Chest 2 View  Result Date: 08/28/2017 CLINICAL DATA:  83 y/o  F; 2 days of shortness of breath EXAM: CHEST  2 VIEW COMPARISON:  05/21/2016 chest radiograph FINDINGS: Stable mildly enlarged cardiac silhouette. Aortic atherosclerosis with calcification. Consolidation in the right lung base. Probable small bilateral effusions. No acute osseous abnormality is evident. IMPRESSION: Right lung base consolidation suspicious for pneumonia, less likely asymmetric edema. Probable small effusions. Electronically Signed   By: Mitzi Hansen M.D.   On: 08/28/2017 23:52        Scheduled Meds: .  pantoprazole (PROTONIX) IV  40 mg Intravenous Q12H  . sodium chloride flush  3 mL Intravenous Q12H   Continuous Infusions: . sodium chloride    . azithromycin Stopped (08/30/17 0203)  . cefTRIAXone (ROCEPHIN)  IV Stopped (08/30/17 0051)     LOS: 1 day        Glade Lloyd, MD [facility] Pager [phone]  If 7PM-7AM, please contact night-coverage [email or web address] Password [email or web address] 08/30/2017, 12:30 PM

## 2017-08-30 NOTE — Evaluation (Signed)
Physical Therapy Evaluation Patient Details Name: Jackie Chandler MRN: [id number] DOB: 10-16-1925 Today's Date: 08/30/2017   History of Present Illness  83 year old female admitted 08/28/18 with cough and dyspnea on exertion. She was found to have pneumonia and probable GI bleed with hemoglobin of 7.1.    PMH:  chronic diastolic CHF, arthritis, tobacco use.  Clinical Impression  Patient presents with problems listed below.  Will benefit from acute PT to maximize functional mobility prior to discharge.  Patient had been ambulatory since fall 2 weeks pta.  Today patient unable to stand with max assist. Recommend SNF at d/c for continued therapy.     Follow Up Recommendations SNF;Supervision/Assistance - 24 hour    Equipment Recommendations  None recommended by PT    Recommendations for Other Services       Precautions / Restrictions Precautions Precautions: Fall Restrictions Weight Bearing Restrictions: No      Mobility  Bed Mobility               General bed mobility comments: Patient in recliner as PT entered.  Transfers Overall transfer level: Needs assistance Equipment used: Rolling walker (2 wheeled) (Nsg using [place]) Transfers: Sit to/from Stand Sit to Stand: Max assist         General transfer comment: Unable to stand.  Will require +2 assist.  Ambulation/Gait             General Gait Details: Unable  Stairs            Wheelchair Mobility    Modified Rankin (Stroke Patients Only)       Balance                                             Pertinent Vitals/Pain Pain Assessment: No/denies pain    Home Living Family/patient expects to be discharged to:: Private residence Living Arrangements: Alone Available Help at Discharge: Family;Available PRN/intermittently;Personal care attendant (Daughter works. Caregivers am and pm for several hrs each.) Type of Home: House Home Access: Stairs to enter Entrance Stairs-Rails:  None Entrance Stairs-Number of Steps: 2 Home Layout: One level Home Equipment: Walker - 2 wheels;Walker - 4 wheels;Cane - single point;Bedside commode      Prior Function Level of Independence: Needs assistance   Gait / Transfers Assistance Needed: Patient ambulates with cane and holding on to daughter.   ADL's / Homemaking Assistance Needed: Daughter and Aides assist with meal prep, bath (sponge bath), housekeeping, errands.        Hand Dominance        Extremity/Trunk Assessment   Upper Extremity Assessment Upper Extremity Assessment: Generalized weakness    Lower Extremity Assessment Lower Extremity Assessment: Generalized weakness       Communication   Communication: No difficulties  Cognition Arousal/Alertness: Awake/alert Behavior During Therapy: Anxious;Flat affect Overall Cognitive Status: Impaired/Different from baseline Area of Impairment: Orientation;Attention;Memory;Following commands;Safety/judgement;Awareness;Problem solving                 Orientation Level: Disoriented to;Place;Situation;Time Current Attention Level: Selective Memory: Decreased short-term memory Following Commands: Follows one step commands with increased time;Follows one step commands inconsistently;Follows multi-step commands inconsistently Safety/Judgement: Decreased awareness of safety   Problem Solving: Slow processing;Decreased initiation;Difficulty sequencing;Requires verbal cues;Requires tactile cues General Comments: Patient with tangential speech, with content not relating to topic/task at hand.  Patient thought she was in her house.  With  my hand in front of patient's face, asked her to touch my and with your nose - patient unable to perform      General Comments      Exercises     Assessment/Plan    PT Assessment Patient needs continued PT services  PT Problem List Decreased strength;Decreased activity tolerance;Decreased mobility;Decreased  coordination;Decreased cognition;Decreased balance;Decreased knowledge of precautions;Decreased knowledge of use of DME;Cardiopulmonary status limiting activity;Obesity       PT Treatment Interventions DME instruction;Gait training;Functional mobility training;Therapeutic activities;Therapeutic exercise;Balance training;Cognitive remediation;Patient/family education    PT Goals (Current goals can be found in the Care Plan section)  Acute Rehab PT Goals Patient Stated Goal: None stated PT Goal Formulation: With patient/family Time For Goal Achievement: 09/06/17 Potential to Achieve Goals: Fair    Frequency Min 3X/week   Barriers to discharge Decreased caregiver support Patient is alone for part of the days and at night.    Co-evaluation               AM-PAC PT "6 Clicks" Daily Activity  Outcome Measure Difficulty turning over in bed (including adjusting bedclothes, sheets and blankets)?: Unable Difficulty moving from lying on back to sitting on the side of the bed? : Unable Difficulty sitting down on and standing up from a chair with arms (e.g., wheelchair, bedside commode, etc,.)?: Unable Help needed moving to and from a bed to chair (including a wheelchair)?: A Lot Help needed walking in hospital room?: Total Help needed climbing 3-5 steps with a railing? : Total 6 Click Score: 7    End of Session Equipment Utilized During Treatment: Oxygen Activity Tolerance: Patient limited by fatigue Patient left: in chair;with call bell/phone within reach;with chair alarm set;with family/visitor present   PT Visit Diagnosis: Other abnormalities of gait and mobility (R26.89);Muscle weakness (generalized) (M62.81)    Time: 1610-9604 PT Time Calculation (min) (ACUTE ONLY): 27 min   Charges:   PT Evaluation $PT Eval Moderate Complexity: 1 Mod PT Treatments $Therapeutic Activity: 8-22 mins   PT G Codes:        Jackie Chandler. Renaldo Fiddler, [facility] Acute Rehab Services Pager  [phone]   Jackie Chandler 08/30/2017, 7:15 PM

## 2017-08-30 NOTE — Progress Notes (Signed)
Jackie [facility] Progress Note  Jackie Chandler 83 y.o. December 26, 1924   Subjective: Feels ok. No further bleeding. Denies abdominal pain. Daughter at bedside.  Objective: Vital signs: Vitals:   08/29/17 2034 08/30/17 0631  BP: (!) 169/74 (!) 152/65  Pulse: 84 87  Resp: 19   Temp: 98.6 F (37 C) 98.6 F (37 C)  SpO2: 97% 99%    Physical Exam: Gen: alert, no acute distress, elderly, frail, well-nourished  HEENT: anicteric sclera CV: RRR Chest: CTA B Abd: soft, nontender, nondistended, +BS   Lab Results:  Recent Labs  08/29/17 0352 08/30/17 0257  NA 139 137  K 2.7* 3.4*  CL 104 104  CO2 26 27  GLUCOSE 146* 127*  BUN 8 <5*  CREATININE 0.92 0.78  CALCIUM 9.0 9.0  MG  --  2.1    Recent Labs  08/28/17 2317  AST 45*  ALT 15  ALKPHOS 56  BILITOT 0.3  PROT 6.5  ALBUMIN 3.5    Recent Labs  08/28/17 2317 08/29/17 0920 08/30/17 0257  WBC 6.4  --  7.4  NEUTROABS 4.9  --  5.4  HGB 7.1* 8.3* 8.6*  HCT 24.8* 27.4* 28.3*  MCV 70.3*  --  72.8*  PLT 332  --  384      Assessment/Plan: GI bleed that has resolved. Hgb improved to 8.6 (7.1 on admit; s/p 1 unit PRBCs). Tolerating clear liquids. No plans for GI endoscopic procedures due to advanced age. Advance diet. PNA on IV Abx and Dr. Hanley Ben planning to transition to PO Abx in next 1-2 days. Defer to him for d/c timing. Will sign off. Call us back if questions. F/U with GI prn.   Jackie Chandler C. 08/30/2017, 12:01 PM  Pager [phone]  AFTER 5 PM or on weekends please call [phone] ID: Jackie Chandler, female   DOB: October 11, 1925, 83 y.o.   MRN: [id number]

## 2017-08-31 ENCOUNTER — Inpatient Hospital Stay (HOSPITAL_COMMUNITY): Payer: Medicare Other

## 2017-08-31 LAB — BASIC METABOLIC PANEL
Anion gap: 8 (ref 5–15)
BUN: 6 mg/dL (ref 6–20)
CALCIUM: 9 mg/dL (ref 8.9–10.3)
CO2: 26 mmol/L (ref 22–32)
CREATININE: 0.84 mg/dL (ref 0.44–1.00)
Chloride: 100 mmol/L — ABNORMAL LOW (ref 101–111)
GFR calc Af Amer: >60 mL/min (ref >60)
GFR calc non Af Amer: 59 mL/min — ABNORMAL LOW (ref >60)
GLUCOSE: 130 mg/dL — AB (ref 65–99)
Potassium: 3.5 mmol/L (ref 3.5–5.1)
Sodium: 134 mmol/L — ABNORMAL LOW (ref 135–145)

## 2017-08-31 LAB — CBC WITH DIFFERENTIAL/PLATELET
BASOS PCT: 1 %
Basophils Absolute: 0.1 10*3/uL (ref 0.0–0.1)
EOS PCT: 1 %
Eosinophils Absolute: 0.1 10*3/uL (ref 0.0–0.7)
HEMATOCRIT: 28.3 % — AB (ref 36.0–46.0)
Hemoglobin: 8.3 g/dL — ABNORMAL LOW (ref 12.0–15.0)
LYMPHS ABS: 0.7 10*3/uL (ref 0.7–4.0)
Lymphocytes Relative: 11 %
MCH: 21.2 pg — AB (ref 26.0–34.0)
MCHC: 29.3 g/dL — AB (ref 30.0–36.0)
MCV: 72.4 fL — AB (ref 78.0–100.0)
MONOS PCT: 12 %
Monocytes Absolute: 0.8 10*3/uL (ref 0.1–1.0)
NEUTROS ABS: 4.7 10*3/uL (ref 1.7–7.7)
Neutrophils Relative %: 75 %
Platelets: 359 10*3/uL (ref 150–400)
RBC: 3.91 MIL/uL (ref 3.87–5.11)
RDW: 19.3 % — ABNORMAL HIGH (ref 11.5–15.5)
WBC: 6.4 10*3/uL (ref 4.0–10.5)

## 2017-08-31 LAB — GLUCOSE, CAPILLARY
GLUCOSE-CAPILLARY: 130 mg/dL — AB (ref 65–99)
Glucose-Capillary: 366 mg/dL — ABNORMAL HIGH (ref 65–99)

## 2017-08-31 LAB — MAGNESIUM: Magnesium: 1.9 mg/dL (ref 1.7–2.4)

## 2017-08-31 MED ORDER — PANTOPRAZOLE SODIUM 40 MG PO TBEC: 40.0000 mg | DELAYED_RELEASE_TABLET | Freq: Two times a day (BID) | ORAL | 0 refills | Status: AC

## 2017-08-31 MED ORDER — AMOXICILLIN-POT CLAVULANATE 500-125 MG PO TABS: 1.0000 | ORAL_TABLET | Freq: Two times a day (BID) | ORAL | 0 refills | Status: AC

## 2017-08-31 MED ORDER — PANTOPRAZOLE SODIUM 40 MG PO TBEC: 40.0000 mg | DELAYED_RELEASE_TABLET | Freq: Two times a day (BID) | ORAL | Status: DC

## 2017-08-31 MED ORDER — INSULIN ASPART 100 UNIT/ML ~~LOC~~ SOLN: 0.0000 [IU] | Freq: Three times a day (TID) | SUBCUTANEOUS | Status: DC

## 2017-08-31 NOTE — [place] FL2 (Signed)
[place] MEDICAID FL2 LEVEL OF CARE SCREENING TOOL     IDENTIFICATION  Patient Name: DRUE CAMERA Birthdate: [birth year] Sex: female Admission Date (Current Location): 08/28/2017  [facility] and [place] Number:  Producer, television/film/video and Address:  [facility], [address] [place], [place] [address]      Provider Number: [id number]  Attending Physician Name and Address:  Glade Lloyd, MD  Relative Name and Phone Number:       Current Level of Care: Hospital Recommended Level of Care: Skilled Nursing Facility Prior Approval Number:    Date Approved/Denied:   PASRR Number: [id number]  Discharge Plan: SNF    Current Diagnoses: Patient Active Problem List   Diagnosis Date Noted  . Symptomatic anemia 08/29/2017  . Melena 08/29/2017  . CAP (community acquired pneumonia) 08/29/2017  . Chronic diastolic heart failure (HCC)   . Venous insufficiency   . Frequent falls 01/25/2016  . Gait instability 01/25/2016    Orientation RESPIRATION BLADDER Height & Weight     Self, Situation, Place  O2 ([place] 2L) Incontinent, External catheter (placed 08/31/17) Weight: 168 lb 8 oz (76.4 kg) Height:   (162.6 cm)  BEHAVIORAL SYMPTOMS/MOOD NEUROLOGICAL BOWEL NUTRITION STATUS      Continent Diet (heart healthy)  AMBULATORY STATUS COMMUNICATION OF NEEDS Skin   Extensive Assist Verbally Normal                       Personal Care Assistance Level of Assistance  Bathing, Dressing Bathing Assistance: Maximum assistance   Dressing Assistance: Maximum assistance     Functional Limitations Info             SPECIAL CARE FACTORS FREQUENCY  PT (By licensed PT), OT (By licensed OT)     PT Frequency: 5x/wk OT Frequency: 5x/wk            Contractures Contractures Info: Not present    Additional Factors Info  Code Status, Allergies Code Status Info: Full Allergies Info: Penicillins, Codeine           Current Medications (08/31/2017):   This is the current hospital active medication list Current Facility-Administered Medications  Medication Dose Route Frequency Provider Last Rate Last Dose  . 0.9 %  sodium chloride infusion  250 mL Intravenous PRN Opyd, Lavone Neri, MD      . acetaminophen (TYLENOL) tablet 650 mg  650 mg Oral Q6H PRN Opyd, Lavone Neri, MD       Or  . acetaminophen (TYLENOL) suppository 650 mg  650 mg Rectal Q6H PRN Opyd, Lavone Neri, MD      . albuterol (PROVENTIL) (2.5 MG/3ML) 0.083% nebulizer solution 2.5 mg  2.5 mg Nebulization Q4H PRN Opyd, Lavone Neri, MD      . furosemide (LASIX) tablet 30 mg  30 mg Oral Daily Hanley Ben, Kshitiz, MD   30 mg at 08/31/17 0933  . HYDROcodone-acetaminophen (NORCO/VICODIN) 5-325 MG per tablet 1-2 tablet  1-2 tablet Oral Q4H PRN Opyd, Lavone Neri, MD   2 tablet at 08/31/17 0615  . Influenza vac split quadrivalent PF (FLUZONE HIGH-DOSE) injection 0.5 mL  0.5 mL Intramuscular Prior to discharge Hanley Ben, Kshitiz, MD      . ondansetron (ZOFRAN) tablet 4 mg  4 mg Oral Q6H PRN Opyd, Lavone Neri, MD       Or  . ondansetron (ZOFRAN) injection 4 mg  4 mg Intravenous Q6H PRN Opyd, Lavone Neri, MD      .  pantoprazole (PROTONIX) EC tablet 40 mg  40 mg Oral BID Alekh, Kshitiz, MD      . sodium chloride flush (NS) 0.9 % injection 3 mL  3 mL Intravenous Q12H Opyd, Lavone Neri, MD   3 mL at 08/31/17 1000  . sodium chloride flush (NS) 0.9 % injection 3 mL  3 mL Intravenous PRN Opyd, Lavone Neri, MD         Discharge Medications: Please see discharge summary for a list of discharge medications.  Relevant Imaging Results:  Relevant Lab Results:   Additional Information SS#: [id number]  Baldemar Lenis, LCSW

## 2017-08-31 NOTE — Progress Notes (Signed)
While MD Geannie Risen was in patient room I wanted to clarify sliding scale not to be given until 12.  He requested I recheck cbg.  Results /dl.  MD stated he would change order back to CBG in mornings only.  Will continue to monitor patient.

## 2017-08-31 NOTE — Discharge Summary (Signed)
Physician Discharge Summary  Jackie Chandler ZOX:[id number] DOB: 1924/12/15 DOA: 08/28/2017  PCP: Myrlene Broker, MD  Admit date: 08/28/2017 Discharge date: 08/31/2017  Admitted From: home Disposition:  Nursing home  Recommendations for Outpatient Follow-up:  1. Follow up with nursing home provider at earliest convenience with repeat CBC/BMP in a few days 2. Follow-up with GI/Dr. Bosie Clos as needed as an outpatient   Home Health: no  Equipment/Devices: none  Discharge Condition: stable  CODE STATUS: DO NOT RESUSCITATE  Diet recommendation: Heart Healthy   Brief/Interim Summary: 83 year old female with history of chronic diastolic CHF presented with cough and dyspnea on exertion. She was found to have pneumonia and probable GI bleed with hemoglobin of 7.1. She was admitted on IV antibiotics; one unit packed red cells was transfused. GI was consulted. Patient was managed conservatively. She'll be discharged to nursing home once bed is available on oral Protonix and oral antibiotics.  Discharge Diagnoses:  Principal Problem:   Symptomatic anemia Active Problems:   Chronic diastolic heart failure (HCC)   Venous insufficiency   Melena   CAP (community acquired pneumonia)  1. Probable upper GI bleeding presenting with melena and symptomatic anemia - Status post 1 unit packed red cells transfusion on 08/29/2017. Hemoglobin 8.3 today. GI evaluation appreciated. GI recommends conservative medical management. Continue Protonix twice a day. Outpatient CBC follow-up. Outpatient GI evaluation with Dr. Bosie Clos if needed. avoid NSAIDs  2. CAP  - currently on Rocephin and Zithromax. Cultures negative so far. discharge on oral Augmentin for 5 more days  3. Chronic diastolic CHF  -continue Lasix. Dose might need to be adjusted in the nursing home. Outpatient follow-up. Patient might need cardiology evaluation as an outpatient. Echo shows ejection fraction of 50-55% with grade 1 diastolic  dysfunction. Monitor input and output. Patient would benefit from daily weights and fluid restriction.   Discharge Instructions  Discharge Instructions    Call MD for:  difficulty breathing, headache or visual disturbances    Complete by:  As directed    Call MD for:  hives    Complete by:  As directed    Call MD for:  persistant dizziness or light-headedness    Complete by:  As directed    Call MD for:  persistant nausea and vomiting    Complete by:  As directed    Call MD for:  severe uncontrolled pain    Complete by:  As directed    Call MD for:  temperature >100.4    Complete by:  As directed    Diet - low sodium heart healthy    Complete by:  As directed    Discharge instructions    Complete by:  As directed    Fall precautions   Increase activity slowly    Complete by:  As directed      Allergies as of 08/31/2017      Reactions   Penicillins Itching   Codeine Other (See Comments)   Makes nervous       Medication List    STOP taking these medications   naproxen sodium 220 MG tablet Commonly known as:  ANAPROX     TAKE these medications   ALLERGY PO Take 1 tablet by mouth daily.   amoxicillin-clavulanate 500-125 MG tablet Commonly known as:  AUGMENTIN Take 1 tablet (500 mg total) by mouth 2 (two) times daily.   aspirin EC 81 MG tablet Take 81 mg by mouth daily.   furosemide 20 MG tablet Commonly known as:  LASIX Take 1.5 tablets (30 mg total) by mouth daily.   pantoprazole 40 MG tablet Commonly known as:  PROTONIX Take 1 tablet (40 mg total) by mouth 2 (two) times daily.      Follow-up Information    Myrlene Broker, MD Follow up in 1 week(s).   Specialty:  Internal Medicine Contact information: [address] [place] [address] [phone]          Allergies  Allergen Reactions  . Penicillins Itching  . Codeine Other (See Comments)    Makes nervous     Consultations: GI  Procedures/Studies: Dg Chest 2  View  Result Date: 08/31/2017 CLINICAL DATA:  Dyspnea, chronic CHF, community-acquired pneumonia. EXAM: CHEST  2 VIEW COMPARISON:  PA and lateral chest x-ray of August 28, 2017 FINDINGS: The lungs are adequately inflated. Confluent alveolar opacity persists at the right base. The interstitial markings of both lungs remain increased and are slightly more conspicuous today. The cardiac silhouette is top-normal in size. The pulmonary vascularity is mildly prominent. There is tortuosity of the descending thoracic aorta and there is mural calcification in the aorta. There is multilevel degenerative disc disease of the thoracic spine. IMPRESSION: Persistent right basilar pneumonia. Mild CHF. No large pleural effusion and no pneumothorax. Thoracic aortic atherosclerosis. Electronically Signed   By: David  [place] M.D.   On: 08/31/2017 07:55   Dg Chest 2 View  Result Date: 08/28/2017 CLINICAL DATA:  83 y/o  F; 2 days of shortness of breath EXAM: CHEST  2 VIEW COMPARISON:  05/21/2016 chest radiograph FINDINGS: Stable mildly enlarged cardiac silhouette. Aortic atherosclerosis with calcification. Consolidation in the right lung base. Probable small bilateral effusions. No acute osseous abnormality is evident. IMPRESSION: Right lung base consolidation suspicious for pneumonia, less likely asymmetric edema. Probable small effusions. Electronically Signed   By: Mitzi Hansen M.D.   On: 08/28/2017 23:52     Echo on 08/29/2017 Study Conclusions  - Left ventricle: The cavity size was normal. Wall thickness was   increased in a pattern of mild LVH. Systolic function was normal.   The estimated ejection fraction was in the range of 50% to 55%.   Wall motion was normal; there were no regional wall motion   abnormalities. Doppler parameters are consistent with abnormal   left ventricular relaxation (grade 1 diastolic dysfunction). - Aortic valve: There was mild to moderate stenosis. Valve area   (VTI):  1.16 cm^2. Valve area (Vmax): 1.05 cm^2. Valve area   (Vmean): 1.04 cm^2. - Pulmonary arteries: PA peak pressure: 37 mm Hg (S).  Subjective:   Discharge Exam: Vitals:   08/30/17 2039 08/31/17 0502  BP: (!) 145/60 (!) 170/60  Pulse: 77 80  Resp:    Temp: 98.7 F (37.1 C) 98.4 F (36.9 C)  SpO2: 96% 91%   Vitals:   08/30/17 1513 08/30/17 2039 08/31/17 0502 08/31/17 0700  BP: 137/61 (!) 145/60 (!) 170/60   Pulse: 77 77 80   Resp: 17     Temp: 98.3 F (36.8 C) 98.7 F (37.1 C) 98.4 F (36.9 C)   TempSrc: Oral Oral Oral   SpO2: 97% 96% 91%   Weight:    76.4 kg (168 lb 8 oz)  Height:        General: Pt is alert, awake, not in acute distress. Elderly female Cardiovascular: rate controlled, S1/S2 + Respiratory: bilateral decreased breath sounds at bases with some scattered crackles Abdominal: Soft, NT, ND, bowel sounds + Extremities: bilateral lower extremity 1-2+  edema, no cyanosis    The results of significant diagnostics from this hospitalization (including imaging, microbiology, ancillary and laboratory) are listed below for reference.     Microbiology: Recent Results (from the past 240 hour(s))  Urine culture     Status: None   Collection Time: 08/29/17 12:35 AM  Result Value Ref Range Status   Specimen Description IN/OUT CATH URINE  Final   Special Requests NONE  Final   Culture NO GROWTH  Final   Report Status 08/30/2017 FINAL  Final  Culture, blood (Routine X 2) w Reflex to ID Panel     Status: None (Preliminary result)   Collection Time: 08/29/17 12:50 AM  Result Value Ref Range Status   Specimen Description BLOOD RIGHT FOREARM  Final   Special Requests   Final    BOTTLES DRAWN AEROBIC AND ANAEROBIC Blood Culture adequate volume   Culture NO GROWTH 2 DAYS  Final   Report Status PENDING  Incomplete  Culture, blood (Routine X 2) w Reflex to ID Panel     Status: None (Preliminary result)   Collection Time: 08/29/17  1:05 AM  Result Value Ref Range Status    Specimen Description BLOOD LEFT HAND  Final   Special Requests   Final    BOTTLES DRAWN AEROBIC ONLY Blood Culture adequate volume   Culture NO GROWTH 2 DAYS  Final   Report Status PENDING  Incomplete     Labs: BNP (last 3 results)  Recent Labs  08/28/17 2317  BNP 71.8   Basic Metabolic Panel:  Recent Labs Lab 08/28/17 2317 08/29/17 0352 08/30/17 0257 08/31/17 0755  NA 134* 139 137 134*  K 3.8 2.7* 3.4* 3.5  CL 101 104 104 100*  CO2 GLUCOSE 148* 146* 127* 130*  BUN 10 8 <5* 6  CREATININE 1.01* 0.92 0.78 0.84  CALCIUM 9.0 9.0 9.0 9.0  MG  --   --  2.1 1.9   Liver Function Tests:  Recent Labs Lab 08/28/17 2317  AST 45*  ALT 15  ALKPHOS 56  BILITOT 0.3  PROT 6.5  ALBUMIN 3.5   No results for input(s): LIPASE, AMYLASE in the last 168 hours. No results for input(s): AMMONIA in the last 168 hours. CBC:  Recent Labs Lab 08/28/17 2317 08/29/17 0920 08/30/17 0257 08/31/17 0755  WBC 6.4  --  7.4 6.4  NEUTROABS 4.9  --  5.4 4.7  HGB 7.1* 8.3* 8.6* 8.3*  HCT 24.8* 27.4* 28.3* 28.3*  MCV 70.3*  --  72.8* 72.4*  PLT 332  --  384 359   Cardiac Enzymes: No results for input(s): CKTOTAL, CKMB, CKMBINDEX, TROPONINI in the last 168 hours. BNP: Invalid input(s): POCBNP CBG:  Recent Labs Lab 08/29/17 0853 08/30/17 0751 08/31/17 0757 08/31/17 1012  GLUCAP 107* 140* 366* 130*   D-Dimer No results for input(s): DDIMER in the last 72 hours. Hgb A1c No results for input(s): HGBA1C in the last 72 hours. Lipid Profile No results for input(s): CHOL, HDL, LDLCALC, TRIG, CHOLHDL, LDLDIRECT in the last 72 hours. Thyroid function studies No results for input(s): TSH, T4TOTAL, T3FREE, THYROIDAB in the last 72 hours.  Invalid input(s): FREET3 Anemia work up  Recent Labs  08/29/17 0352  VITAMINB12 448  FOLATE 23.2  FERRITIN 10*  TIBC 363  IRON 13*  RETICCTPCT 2.3   Urinalysis    Component Value Date/Time   COLORURINE YELLOW 08/29/2017  0036   APPEARANCEUR CLEAR 08/29/2017 0036   LABSPEC 1.012  08/29/2017 0036   PHURINE 6.0 08/29/2017 0036   GLUCOSEU NEGATIVE 08/29/2017 0036   HGBUR NEGATIVE 08/29/2017 0036   BILIRUBINUR NEGATIVE 08/29/2017 0036   KETONESUR NEGATIVE 08/29/2017 0036   PROTEINUR NEGATIVE 08/29/2017 0036   NITRITE NEGATIVE 08/29/2017 0036   LEUKOCYTESUR NEGATIVE 08/29/2017 0036   Sepsis Labs Invalid input(s): PROCALCITONIN,  WBC,  LACTICIDVEN Microbiology Recent Results (from the past 240 hour(s))  Urine culture     Status: None   Collection Time: 08/29/17 12:35 AM  Result Value Ref Range Status   Specimen Description IN/OUT CATH URINE  Final   Special Requests NONE  Final   Culture NO GROWTH  Final   Report Status 08/30/2017 FINAL  Final  Culture, blood (Routine X 2) w Reflex to ID Panel     Status: None (Preliminary result)   Collection Time: 08/29/17 12:50 AM  Result Value Ref Range Status   Specimen Description BLOOD RIGHT FOREARM  Final   Special Requests   Final    BOTTLES DRAWN AEROBIC AND ANAEROBIC Blood Culture adequate volume   Culture NO GROWTH 2 DAYS  Final   Report Status PENDING  Incomplete  Culture, blood (Routine X 2) w Reflex to ID Panel     Status: None (Preliminary result)   Collection Time: 08/29/17  1:05 AM  Result Value Ref Range Status   Specimen Description BLOOD LEFT HAND  Final   Special Requests   Final    BOTTLES DRAWN AEROBIC ONLY Blood Culture adequate volume   Culture NO GROWTH 2 DAYS  Final   Report Status PENDING  Incomplete     Time coordinating discharge: 35  minutes  SIGNED:   Glade Lloyd, MD  [facility] 08/31/2017, 1:27 PM Pager: [phone]  If 7PM-7AM, please contact night-coverage [email or web address] Password [email or web address]

## 2017-08-31 NOTE — Clinical Social Work Note (Signed)
Clinical Social Work Assessment  Patient Details  Name: Jackie Chandler MRN: [id number] Date of Birth: 05/10/1925  Date of referral:  08/31/17               Reason for consult:  Facility Placement                Permission sought to share information with:  Facility Sport and exercise psychologist, Family Supports Permission granted to share information::  Yes, Verbal Permission Granted  Name::     Pensions consultant::  SNF  Relationship::  Daughter  Contact Information:     Housing/Transportation Living arrangements for the past 2 months:  Single Family Home Source of Information:  Adult Children Patient Interpreter Needed:  None Criminal Activity/Legal Involvement Pertinent to Current Situation/Hospitalization:  No - Comment as needed Significant Relationships:  Adult Children Lives with:  Self Do you feel safe going back to the place where you live?  Yes Need for family participation in patient care:  Yes (Comment) (patient not fully oriented)  Care giving concerns:  Patient has been living at home alone with daughter providing some caregiving when she can, however the patient will need more support than what is currently available for her at discharge; will benefit from short term rehab placement to improve ability to return home safely.   Social Worker assessment / plan:  CSW met with patient's daughter, Silva Bandy, at bedside to discuss recommendation for SNF and discharge planning. CSW provided facility list for daughter to review, and daughter indicated preference for [facility], [facility], or [facility]. CSW to check on bed availability and follow up to facilitate discharge when medically ready.  Employment status:  Retired Nurse, adult PT Recommendations:  [place] / Referral to community resources:  [place]  Patient/Family's Response to care:  Patient's response unable to be assessed due to not being  oriented; patient's daughter is agreeable to SNF placement and says that the patient is agreeable when she's discussed with her.  Patient/Family's Understanding of and Emotional Response to Diagnosis, Current Treatment, and Prognosis:  Patient's daughter is aware that the patient will not be able to receive the support at home that she needs at this time. Patient's daughter discussed that the patient was requiring some help with ADLs at baseline; daughter would go over to help with cooking and cleaning, as well as a caregiver that comes to help during the day while the daughter works. However, patient's daughter is aware that the patient requires more care at this time, but is hopeful that she will be able to recover strength and hopefully return home with her previous level of functioning.  Emotional Assessment Appearance:  Appears stated age Attitude/Demeanor/Rapport:  Unable to Assess Affect (typically observed):  Unable to Assess Orientation:  Oriented to Self, Oriented to Place, Oriented to Situation Alcohol / Substance use:  Not Applicable Psych involvement (Current and /or in the community):  No (Comment)  Discharge Needs  Concerns to be addressed:  Care Coordination Readmission within the last 30 days:  No Current discharge risk:  Lives alone, Physical Impairment Barriers to Discharge:  Continued Medical Work up, Patrick, [place] 08/31/2017, 12:26 PM

## 2017-08-31 NOTE — Progress Notes (Signed)
Patient 0800 cbg 366.  Notified MD K. Alekh of results.  MD ordered sliding scale to begin at 1200.  Will continue to monitor.

## 2017-08-31 NOTE — Progress Notes (Signed)
Patient discharged.  IV removed from LFA without complication.  Patient is alert and oriented to person, place, and situation but not time.  Transported via [facility] to [facility].  Eye glasses placed in bag with patients other belongings and given to the transporters along with the AVS.  VS stable.  Called and gave report to nurse at [facility]

## 2017-08-31 NOTE — Clinical Social Work Placement (Signed)
Nurse to call report to [phone], Room 106P    CLINICAL SOCIAL WORK PLACEMENT  NOTE  Date:  08/31/2017  Patient Details  Name: Jackie Chandler MRN: [id number] Date of Birth: 21-Mar-1925  Clinical Social Work is seeking post-discharge placement for this patient at the Skilled  Nursing Facility level of care (*CSW will initial, date and re-position this form in  chart as items are completed):  Yes   Patient/family provided with [place] Clinical Social Work Department's list of facilities offering this level of care within the geographic area requested by the patient (or if unable, by the patient's family).  Yes   Patient/family informed of their freedom to choose among providers that offer the needed level of care, that participate in Medicare, Medicaid or managed care program needed by the patient, have an available bed and are willing to accept the patient.  Yes   Patient/family informed of [facility]'s ownership interest in [facility] and [facility], as well as of the fact that they are under no obligation to receive care at these facilities.  PASRR submitted to EDS on 08/31/17     PASRR number received on 08/31/17     Existing PASRR number confirmed on       FL2 transmitted to all facilities in geographic area requested by pt/family on       FL2 transmitted to all facilities within larger geographic area on 08/31/17     Patient informed that his/her managed care company has contracts with or will negotiate with certain facilities, including the following:        Yes   Patient/family informed of bed offers received.  Patient [facility] bed at [facility]     Physician recommends and patient chooses bed at      Patient to be transferred to [facility] on 08/31/17.  Patient to be transferred to facility by [facility]     Patient family notified on 08/31/17 of transfer.  Name of family member notified:  Jamesetta So     PHYSICIAN       Additional Comment:     _______________________________________________ Baldemar Lenis, LCSW 08/31/2017, 1:56 PM

## 2017-09-03 LAB — CULTURE, BLOOD (ROUTINE X 2)
CULTURE: NO GROWTH
CULTURE: NO GROWTH
SPECIAL REQUESTS: ADEQUATE
SPECIAL REQUESTS: ADEQUATE

## 2017-09-04 ENCOUNTER — Inpatient Hospital Stay: Payer: Medicare Other | Admitting: Internal Medicine

## 2017-10-20 ENCOUNTER — Ambulatory Visit: Payer: Medicare Other | Admitting: Podiatry

## 2017-10-31 DEATH — deceased

## 2017-11-02 ENCOUNTER — Telehealth: Payer: Self-pay | Admitting: Internal Medicine

## 2017-11-02 NOTE — Telephone Encounter (Signed)
Noted, thanks!

## 2017-11-02 NOTE — Telephone Encounter (Signed)
Copied from CRM [id number]. Topic: Inquiry >> Nov 02, 2017 12:11 PM Alexander BergeronBarksdale, Harvey B wrote: Reason for CRM: Dossie Arbourhyllis Williams daughter of pt called to confirm that the pt has passed

## 2018-03-31 IMAGING — CR DG CHEST 1V PORT
1 series · 1 of 1 positions shown · non-contrast
Comparison: No priors.

CLINICAL DATA: 89-year-old female with history of bilateral lower
extremities soft tissue swelling and some lower extremity
discomfort. History of trauma from a fall 1 week ago.

EXAM:
PORTABLE CHEST 1 VIEW

[AP]
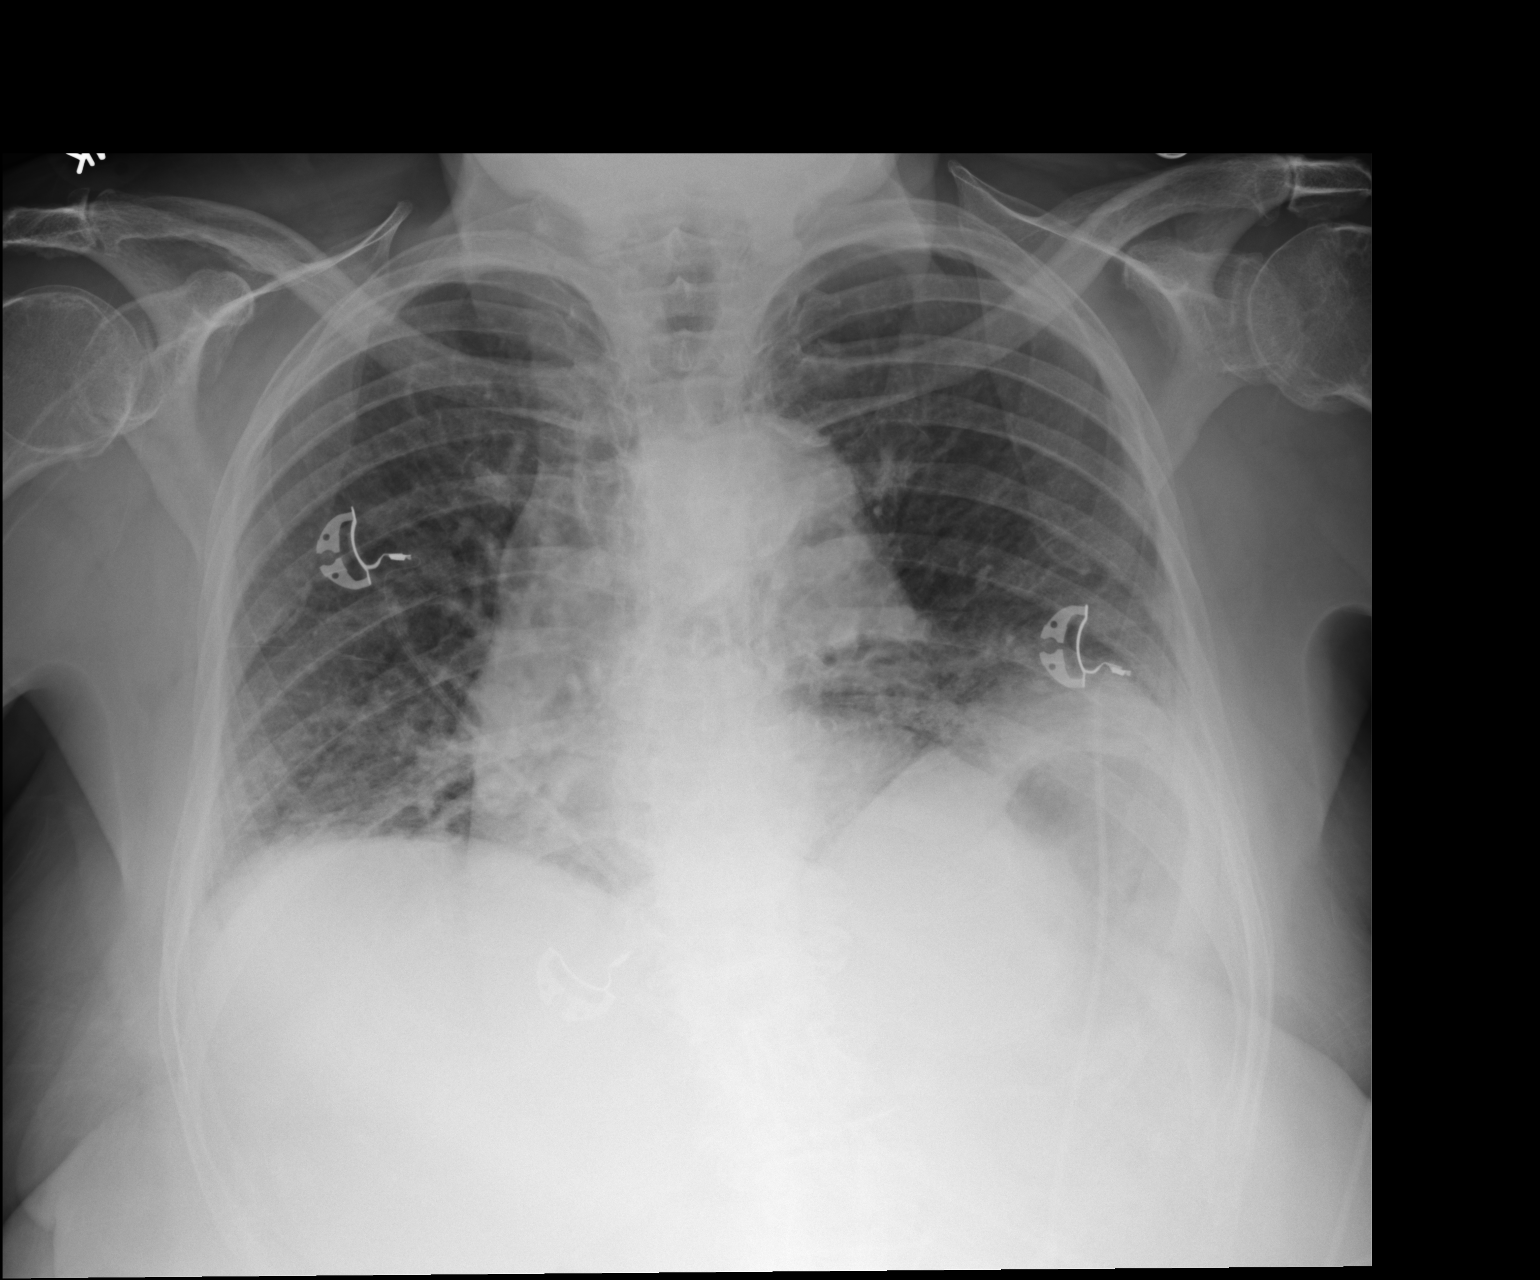

[1 of 1 positions shown; findings below may reference images not displayed]

FINDINGS: Low lung volumes. Bibasilar opacities favored to predominantly
reflect areas of subsegmental atelectasis, although developing areas
of airspace consolidation are not excluded. No definite pleural
effusions. Elevation of the left hemidiaphragm. Crowding of the
pulmonary vasculature, without frank pulmonary edema. Heart size
appears likely enlarged, accentuated by low lung volumes. Aortic
atherosclerosis. Upper mediastinal contours are within normal
limits.
IMPRESSION: 1. Low lung volumes with probable bibasilar subsegmental
atelectasis.
2. Elevation of left hemidiaphragm.
3. Aortic atherosclerosis.
